# Patient Record
Sex: Male | Born: 1950 | Race: White | Hispanic: No | Smoking: Never smoker
Health system: Southern US, Community
[De-identification: ages and names within clinical notes are randomized; demographics above are authoritative.]

## PROBLEM LIST (undated history)

## (undated) DIAGNOSIS — I10 Essential (primary) hypertension: Secondary | ICD-10-CM

## (undated) DIAGNOSIS — E119 Type 2 diabetes mellitus without complications: Secondary | ICD-10-CM

## (undated) DIAGNOSIS — I2510 Atherosclerotic heart disease of native coronary artery without angina pectoris: Secondary | ICD-10-CM

## (undated) HISTORY — PX: OTHER SURGICAL HISTORY: SHX169

## (undated) HISTORY — PX: CARDIAC SURGERY: SHX584

## (undated) HISTORY — PX: ABDOMINAL SURGERY: SHX537

## (undated) HISTORY — PX: FRACTURE SURGERY: SHX138

## (undated) HISTORY — PX: NECK SURGERY: SHX720

## (undated) HISTORY — PX: EYE SURGERY: SHX253

---

## 2019-04-23 ENCOUNTER — Ambulatory Visit (INDEPENDENT_AMBULATORY_CARE_PROVIDER_SITE_OTHER): Payer: Self-pay

## 2019-04-23 ENCOUNTER — Encounter: Payer: Self-pay | Admitting: Emergency Medicine

## 2019-04-23 ENCOUNTER — Other Ambulatory Visit: Payer: Self-pay

## 2019-04-23 ENCOUNTER — Ambulatory Visit
Admission: EM | Admit: 2019-04-23 | Discharge: 2019-04-23 | Disposition: A | Discharge status: Home / Self Care | Payer: Self-pay

## 2019-04-23 DIAGNOSIS — M25552 Pain in left hip: Secondary | ICD-10-CM

## 2019-04-23 DIAGNOSIS — M25562 Pain in left knee: Secondary | ICD-10-CM

## 2019-04-23 DIAGNOSIS — I70209 Unspecified atherosclerosis of native arteries of extremities, unspecified extremity: Secondary | ICD-10-CM

## 2019-04-23 DIAGNOSIS — I70213 Atherosclerosis of native arteries of extremities with intermittent claudication, bilateral legs: Secondary | ICD-10-CM

## 2019-04-23 HISTORY — DX: Essential (primary) hypertension: I10

## 2019-04-23 HISTORY — DX: Atherosclerotic heart disease of native coronary artery without angina pectoris: I25.10

## 2019-04-23 HISTORY — DX: Type 2 diabetes mellitus without complications: E11.9

## 2019-04-23 MED ORDER — PREDNISONE 10 MG (21) PO TBPK: ORAL_TABLET | Freq: Every day | ORAL | 0 refills | Status: AC

## 2019-04-23 NOTE — Discharge Instructions (Addendum)
May take Prednisone 10mg  tablets - take 6 tablets today and tomorrow and then decrease by 1 tablet every 2 days until finished on day 12. May increase sugar levels and blood pressure so continue to monitor. Call the [facility] today to schedule appointment as soon as possible for follow-up for left hip pain and other concerns.

## 2019-04-23 NOTE — ED Provider Notes (Signed)
[facility]    CSN: [id number] Arrival date & time: 04/23/19  1144     History   Chief Complaint Chief Complaint  Patient presents with   Hip Pain    HPI Colin ArmourJohn Levine is a 68 y.o. male.   68 year old male presents with left hip and lower back pain that started about 1 week ago. He was outside and fell on his metal ramp up to his house. He landed on his left side and has experienced pain in his left hip and knee for the past week. His left hip pain is getting worse and he is concerned over possible fracture or other injury. He does have slight decreased sensation in his left leg which he is uncertain if it stems from his diabetes. He is able to walk with a cane. He has applied a "cream" for pain with minimal relief. He also has Ultram that he takes twice a day as needed for pain with minimal relief. He usually goes to the [facility] in [place] but he was unable to get a ride today and decided to come to Urgent Care here in [place] instead. He has a history of multiple chronic health issues including HTN, CAD, hyperlipidemia, Diabetes, Mood disorder, thyroid disorder, and arthritis. He is currently on Tenoretic, Imdur, Prazosin, Lisinopril, Sotalol, Lipitor, Glucophage, aspirin, Synthroid, Wellbutrin, Zoloft, Remeron, and Ultram daily. He is a poor historian and had difficulty with providing history of his current symptoms.   The history is provided by the patient.    Past Medical History:  Diagnosis Date   Coronary artery disease    Diabetes mellitus without complication (HCC)    Hypertension     There are no active problems to display for this patient.   Past Surgical History:  Procedure Laterality Date   ABDOMINAL SURGERY     bipass surgery     CARDIAC SURGERY     EYE SURGERY     FRACTURE SURGERY     NECK SURGERY         Home Medications    Prior to Admission medications   Medication Sig Start Date End Date Taking? Authorizing Provider  aspirin 81 MG  chewable tablet Chew by mouth daily.   Yes [provider]  atenolol-chlorthalidone (TENORETIC) 50-25 MG tablet Take 1 tablet by mouth daily.   Yes [provider]  atorvastatin (LIPITOR) 40 MG tablet Take 40 mg by mouth daily.   Yes [provider]  buPROPion (WELLBUTRIN) 100 MG tablet Take 100 mg by mouth 2 (two) times daily.   Yes [provider]  cetirizine (ZYRTEC) 10 MG tablet Take 10 mg by mouth daily.   Yes [provider]  isosorbide mononitrate (IMDUR) 30 MG 24 hr tablet Take 30 mg by mouth daily.   Yes [provider]  levothyroxine (SYNTHROID) 50 MCG tablet Take 50 mcg by mouth daily before breakfast.   Yes [provider]  lisinopril (ZESTRIL) 40 MG tablet Take 40 mg by mouth daily.   Yes [provider]  metFORMIN (GLUCOPHAGE) 500 MG tablet Take by mouth 2 (two) times daily with a meal.   Yes [provider]  mirtazapine (REMERON) 7.5 MG tablet Take 7.5 mg by mouth at bedtime.   Yes [provider]  prazosin (MINIPRESS) 5 MG capsule Take 5 mg by mouth at bedtime.   Yes [provider]  sennosides-docusate sodium (SENOKOT-S) 8.6-50 MG tablet Take 1 tablet by mouth daily.   Yes [provider]  sertraline (ZOLOFT) 100 MG tablet Take 100 mg by mouth daily.   Yes [provider]  sotalol (BETAPACE) 80 MG tablet Take 80 mg by mouth 2 (two) times daily.   Yes [provider]  traMADol (ULTRAM) 50 MG tablet Take by mouth every 6 (six) hours as needed.   Yes [provider]  predniSONE (STERAPRED UNI-PAK 21 TAB) 10 MG (21) TBPK tablet Take by mouth daily. Take 6 tabs by mouth daily  for 2 days, then decrease by 1 tablet every 2 days until finished on day 12. 04/23/19   Leland Raver, Ali LoweAnn Berry, NP    Family History Family History  Problem Relation Age of Onset   Diabetes Mother    Lupus Mother    Cancer Brother     Social History Social History   Tobacco  Use   Smoking status: Never Smoker   Smokeless tobacco: Never Used  Substance Use Topics   Alcohol use: Never    Frequency: Never   Drug use: Never     Allergies   Patient has no allergy information on record.   Review of Systems Review of Systems  Constitutional: Positive for activity change. Negative for appetite change, chills, fatigue and fever.  Eyes: Negative for photophobia and visual disturbance.  Respiratory: Negative for cough, chest tightness, shortness of breath and wheezing.   Cardiovascular: Positive for leg swelling. Negative for chest pain.  Gastrointestinal: Negative for abdominal pain, nausea and vomiting.  Genitourinary: Positive for decreased urine volume and difficulty urinating. Negative for flank pain, frequency, testicular pain and urgency.  Musculoskeletal: Positive for arthralgias, back pain, gait problem and myalgias. Negative for joint swelling.  Skin: Positive for color change. Negative for rash and wound.  Neurological: Positive for light-headedness and numbness. Negative for dizziness, tremors, seizures, syncope, speech difficulty, weakness and headaches.  Hematological: Negative for adenopathy. Bruises/bleeds easily.  Psychiatric/Behavioral: Positive for agitation and dysphoric mood.     Physical Exam Triage Vital Signs ED Triage Vitals  Enc Vitals Group     BP 04/23/19 1235 135/64     Pulse Rate 04/23/19 1235 68     Resp 04/23/19 1235 16     Temp 04/23/19 1235 97.8 F (36.6 C)     Temp Source 04/23/19 1235 Oral     SpO2 04/23/19 1235 98 %     Weight 04/23/19 1240 185 lb (83.9 kg)     Height 04/23/19 1240 5\' 9"  (1.753 m)     Head Circumference --      Peak Flow --      Pain Score 04/23/19 1239 9     Pain Loc --      Pain Edu? --      Excl. in GC? --    No data found.  Updated Vital Signs BP 135/64 (BP Location: Left Arm)    Pulse 68    Temp 97.8 F (36.6 C) (Oral)    Resp 16    Ht 5\' 9"  (1.753 m)    Wt 185 lb (83.9 kg)    SpO2  98%    BMI 27.32 kg/m   Visual Acuity Right Eye Distance:   Left Eye Distance:   Bilateral Distance:    Right Eye Near:   Left Eye Near:    Bilateral Near:     Physical Exam Vitals signs and nursing note reviewed.  Constitutional:      General: He is awake. He is not in acute distress.  Appearance: He is well-developed and well-groomed. He is not ill-appearing.     Comments: Patient sitting in wheel chair in no acute distress. Able to move to exam table with assistance.   HENT:     Head: Normocephalic and atraumatic.  Eyes:     Extraocular Movements: Extraocular movements intact.     Conjunctiva/sclera: Conjunctivae normal.  Neck:     Musculoskeletal: Normal range of motion.  Cardiovascular:     Rate and Rhythm: Normal rate.  Pulmonary:     Effort: Pulmonary effort is normal.  Musculoskeletal:        General: Tenderness present. No swelling.     Left hip: He exhibits decreased range of motion and tenderness. He exhibits normal strength, no swelling, no crepitus and no deformity.     Right lower leg: Edema present.     Left lower leg: Edema present.       Legs:     Comments: Has decreased range of motion of left hip, especially with flexion. Tender along more posterior aspect of hip and into buttock. Slight decreased sensation of left lower leg. Good distal pulses.  Normal range of motion of left knee. No bruising, swelling or tenderness present.   Skin:    General: Skin is warm.     Capillary Refill: Capillary refill takes less than 2 seconds.     Findings: Erythema present.     Comments: Bilateral lower leg erythema with darker discoloration of skin. Slight swelling present but no tenderness. No pitting edema.   Neurological:     General: No focal deficit present.     Mental Status: He is alert and oriented to person, place, and time. He is confused.     Sensory: Sensory deficit present.     Motor: Motor function is intact. No tremor, atrophy, abnormal muscle tone or  seizure activity.     Comments: Decreased sensation of left lower leg and foot.   Psychiatric:        Attention and Perception: Attention normal.        Mood and Affect: Mood normal.        Speech: Speech normal.        Behavior: Behavior is agitated. Behavior is cooperative.        Thought Content: Thought content normal.      UC Treatments / Results  Labs (all labs ordered are listed, but only abnormal results are displayed) Labs Reviewed - No data to display  EKG   Radiology Dg Hip Unilat With Pelvis 2-3 Views Left  Result Date: 04/23/2019 CLINICAL DATA:  Fall 1 week ago, pain LEFT lateral hip radiating anteriorly to knee. EXAM: DG HIP (WITH OR WITHOUT PELVIS) 2-3V LEFT COMPARISON:  None. FINDINGS: Osseous alignment is normal. Heterogeneous mineralization, but symmetric within the pelvis suggesting interspersed areas of osteopenia and normal mineralization. No circumscribed blastic or lytic appearing lesion. No fracture line or displaced fracture fragment seen. Mild degenerative narrowing at the bilateral hip joints, RIGHT greater than LEFT. No large osteophytes or other signs of advanced degenerative joint disease. 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 IMPRESSION: 1. No acute findings. No osseous fracture or dislocation. 2. Mild degenerative change at the bilateral hip joints, RIGHT greater than LEFT. 3. 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 Electronically Signed   By: Franki Cabot M.D.   On: 04/23/2019 13:50    Procedures Procedures (including critical care time)  Medications Ordered in UC Medications - No data to display  Initial  Impression / Assessment and Plan / UC Course  I have reviewed the triage vital signs and the nursing notes.  Pertinent labs & imaging results that were available during my care of the patient were reviewed by me and considered in my medical decision making (see chart for details).    Reviewed x-ray findings  with patient. No distinct fracture. Discussed arthritic changes in both hips. Also discussed femoral artery atherosclerosis and concern over decreased sensation in left leg. Discussed that he needs further evaluation with Vascular. Patient has a Development worker, international aidCardiologist at [facility]. Patient indicated that he has taken Prednisone before for inflammation with no side effects. Discussed that Prednisone can raise blood sugar (he is uncertain what his levels have been since his continuous glucose monitor is not connected) and can also raise blood pressure- need to continue to monitor. May take Prednisone 10mg  12 day dose pack as directed. [facility] and further evaluation of multiple concerns.  Final Clinical Impressions(s) / UC Diagnoses   Final diagnoses:  Acute hip pain, left  Acute pain of left knee  Femoral-popliteal artery atherosclerosis [facility](HCC)     Discharge Instructions     May take Prednisone 10mg  tablets - take 6 tablets today and tomorrow and then decrease by 1 tablet every 2 days until finished on day 12. May increase sugar levels and blood pressure so continue to monitor. [facility] to [facility] as soon as possible for follow-up for left hip pain and other concerns.     ED Prescriptions    Medication Sig Dispense Auth. Provider   predniSONE (STERAPRED UNI-PAK 21 TAB) 10 MG (21) TBPK tablet Take by mouth daily. Take 6 tabs by mouth daily  for 2 days, then decrease by 1 tablet every 2 days until finished on day 12. 42 tablet Diyana Starrett, Ali LoweAnn Berry, NP     Controlled Substance Prescriptions [place] Controlled Substance Registry consulted? Yes, I have consulted the [place] Controlled Substances Registry for this patient. His only active Rx is for Tramadol which he receives #60 tablets monthly. Do not feel any additional narcotic medication is indicated at this time.    Sudie GrumblingAmyot, Tatsuya Okray Berry, NP 04/23/19 2153

## 2019-04-23 NOTE — ED Triage Notes (Signed)
Patient states he fell a week ago and he has had sharp pain in his left hip since the fall

## 2019-04-29 ENCOUNTER — Encounter: Payer: Self-pay | Admitting: Emergency Medicine

## 2019-04-29 ENCOUNTER — Emergency Department
Admission: EM | Admit: 2019-04-29 | Discharge: 2019-04-29 | Disposition: A | Discharge status: Home / Self Care | Payer: No Typology Code available for payment source | Attending: Emergency Medicine | Admitting: Emergency Medicine

## 2019-04-29 ENCOUNTER — Emergency Department: Payer: No Typology Code available for payment source

## 2019-04-29 ENCOUNTER — Other Ambulatory Visit: Payer: Self-pay

## 2019-04-29 DIAGNOSIS — Z79899 Other long term (current) drug therapy: Secondary | ICD-10-CM | POA: Insufficient documentation

## 2019-04-29 DIAGNOSIS — M1712 Unilateral primary osteoarthritis, left knee: Secondary | ICD-10-CM | POA: Insufficient documentation

## 2019-04-29 DIAGNOSIS — Z7984 Long term (current) use of oral hypoglycemic drugs: Secondary | ICD-10-CM | POA: Diagnosis not present

## 2019-04-29 DIAGNOSIS — R300 Dysuria: Secondary | ICD-10-CM | POA: Diagnosis not present

## 2019-04-29 DIAGNOSIS — M25562 Pain in left knee: Secondary | ICD-10-CM | POA: Insufficient documentation

## 2019-04-29 DIAGNOSIS — E119 Type 2 diabetes mellitus without complications: Secondary | ICD-10-CM | POA: Insufficient documentation

## 2019-04-29 DIAGNOSIS — R109 Unspecified abdominal pain: Secondary | ICD-10-CM | POA: Insufficient documentation

## 2019-04-29 DIAGNOSIS — Z9181 History of falling: Secondary | ICD-10-CM | POA: Diagnosis not present

## 2019-04-29 DIAGNOSIS — Z7982 Long term (current) use of aspirin: Secondary | ICD-10-CM | POA: Insufficient documentation

## 2019-04-29 DIAGNOSIS — I2510 Atherosclerotic heart disease of native coronary artery without angina pectoris: Secondary | ICD-10-CM | POA: Insufficient documentation

## 2019-04-29 DIAGNOSIS — I10 Essential (primary) hypertension: Secondary | ICD-10-CM | POA: Diagnosis not present

## 2019-04-29 DIAGNOSIS — M79605 Pain in left leg: Secondary | ICD-10-CM | POA: Diagnosis present

## 2019-04-29 LAB — CBC WITH DIFFERENTIAL/PLATELET
Abs Immature Granulocytes: 0.02 10*3/uL (ref 0.00–0.07)
Basophils Absolute: 0 10*3/uL (ref 0.0–0.1)
Basophils Relative: 1 %
Eosinophils Absolute: 0.1 10*3/uL (ref 0.0–0.5)
Eosinophils Relative: 2 %
HCT: 35.7 % — ABNORMAL LOW (ref 39.0–52.0)
Hemoglobin: 11.4 g/dL — ABNORMAL LOW (ref 13.0–17.0)
Immature Granulocytes: 0 %
Lymphocytes Relative: 13 %
Lymphs Abs: 0.9 10*3/uL (ref 0.7–4.0)
MCH: 23.4 pg — ABNORMAL LOW (ref 26.0–34.0)
MCHC: 31.9 g/dL (ref 30.0–36.0)
MCV: 73.3 fL — ABNORMAL LOW (ref 80.0–100.0)
Monocytes Absolute: 0.5 10*3/uL (ref 0.1–1.0)
Monocytes Relative: 8 %
Neutro Abs: 5.1 10*3/uL (ref 1.7–7.7)
Neutrophils Relative %: 76 %
Platelets: 167 10*3/uL (ref 150–400)
RBC: 4.87 MIL/uL (ref 4.22–5.81)
RDW: 16.3 % — ABNORMAL HIGH (ref 11.5–15.5)
WBC: 6.7 10*3/uL (ref 4.0–10.5)
nRBC: 0 % (ref 0.0–0.2)

## 2019-04-29 LAB — URINALYSIS, COMPLETE (UACMP) WITH MICROSCOPIC
Bacteria, UA: NONE SEEN
Bilirubin Urine: NEGATIVE
Glucose, UA: >=500 mg/dL — AB
Hgb urine dipstick: NEGATIVE
Ketones, ur: NEGATIVE mg/dL
Leukocytes,Ua: NEGATIVE
Nitrite: NEGATIVE
Protein, ur: 100 mg/dL — AB
Specific Gravity, Urine: 1.01 (ref 1.005–1.030)
Squamous Epithelial / HPF: NONE SEEN (ref 0–5)
pH: 6 (ref 5.0–8.0)

## 2019-04-29 LAB — COMPREHENSIVE METABOLIC PANEL
ALT: 19 U/L (ref 0–44)
AST: 20 U/L (ref 15–41)
Albumin: 4 g/dL (ref 3.5–5.0)
Alkaline Phosphatase: 52 U/L (ref 38–126)
Anion gap: 9 (ref 5–15)
BUN: 41 mg/dL — ABNORMAL HIGH (ref 8–23)
CO2: 27 mmol/L (ref 22–32)
Calcium: 9.4 mg/dL (ref 8.9–10.3)
Chloride: 101 mmol/L (ref 98–111)
Creatinine, Ser: 1.74 mg/dL — ABNORMAL HIGH (ref 0.61–1.24)
GFR calc Af Amer: 45 mL/min — ABNORMAL LOW (ref >60)
GFR calc non Af Amer: 39 mL/min — ABNORMAL LOW (ref >60)
Glucose, Bld: 238 mg/dL — ABNORMAL HIGH (ref 70–99)
Potassium: 3.5 mmol/L (ref 3.5–5.1)
Sodium: 137 mmol/L (ref 135–145)
Total Bilirubin: 0.7 mg/dL (ref 0.3–1.2)
Total Protein: 7.1 g/dL (ref 6.5–8.1)

## 2019-04-29 MED ORDER — ACETAMINOPHEN 500 MG PO TABS
1000.0000 mg | ORAL_TABLET | Freq: Once | ORAL | Status: AC
  Administered 2019-04-29: 1000 mg via ORAL
  Filled 2019-04-29: qty 2

## 2019-04-29 MED ORDER — CHLORTHALIDONE 25 MG PO TABS
25.0000 mg | ORAL_TABLET | Freq: Every day | ORAL | Status: DC
  Filled 2019-04-29: qty 1

## 2019-04-29 MED ORDER — SODIUM CHLORIDE 0.9 % IV BOLUS
1000.0000 mL | Freq: Once | INTRAVENOUS | Status: AC
  Administered 2019-04-29: 1000 mL via INTRAVENOUS

## 2019-04-29 MED ORDER — ATENOLOL 25 MG PO TABS
50.0000 mg | ORAL_TABLET | Freq: Once | ORAL | Status: AC
  Administered 2019-04-29: 07:00:00 50 mg via ORAL
  Filled 2019-04-29: qty 2

## 2019-04-29 MED ORDER — LISINOPRIL 10 MG PO TABS
40.0000 mg | ORAL_TABLET | Freq: Once | ORAL | Status: AC
  Administered 2019-04-29: 40 mg via ORAL
  Filled 2019-04-29: qty 4

## 2019-04-29 MED ORDER — TRAMADOL HCL 50 MG PO TABS: 50.0000 mg | ORAL_TABLET | Freq: Four times a day (QID) | ORAL | 0 refills | Status: AC | PRN

## 2019-04-29 MED ORDER — FENTANYL CITRATE (PF) 100 MCG/2ML IJ SOLN
50.0000 ug | Freq: Once | INTRAMUSCULAR | Status: AC
  Administered 2019-04-29: 50 ug via INTRAVENOUS
  Filled 2019-04-29: qty 2

## 2019-04-29 NOTE — ED Notes (Signed)
Patient urinated 250 ml. Bladder scan post void 139.

## 2019-04-29 NOTE — ED Provider Notes (Signed)
[facility] Emergency Department Provider Note  ____________________________________________  Time seen: Approximately 6:25 AM  I have reviewed the triage vital signs and the nursing notes.   HISTORY  Chief Complaint Back Pain   HPI Colin Levine is a 68 y.o. male with a history of CAD, hypertension, diabetes who presents for evaluation of knee pain abdominal pain.  Patient reports that he has had sharp stabbing knee pain that is worse with ambulation for the last week since he had a fall.  His pain was extremely bad this past night.  Also endorses 2 weeks of dysuria and difficulty urinating.   Since last night patient started having pain in his suprapubic region and left flank which he describes as sharp, constant and moderate in intensity.  No nausea or vomiting.  No hip pain.   Past Medical History:  Diagnosis Date   Coronary artery disease    Diabetes mellitus without complication ([place])    Hypertension      Past Surgical History:  Procedure Laterality Date   ABDOMINAL SURGERY     bipass surgery     CARDIAC SURGERY     EYE SURGERY     FRACTURE SURGERY     NECK SURGERY      Prior to Admission medications   Medication Sig Start Date End Date Taking? Authorizing Provider  aspirin 81 MG chewable tablet Chew by mouth daily.    [provider]  atenolol-chlorthalidone (TENORETIC) 50-25 MG tablet Take 1 tablet by mouth daily.    [provider]  atorvastatin (LIPITOR) 40 MG tablet Take 40 mg by mouth daily.    [provider]  buPROPion (WELLBUTRIN) 100 MG tablet Take 100 mg by mouth 2 (two) times daily.    [provider]  cetirizine (ZYRTEC) 10 MG tablet Take 10 mg by mouth daily.    [provider]  isosorbide mononitrate (IMDUR) 30 MG 24 hr tablet Take 30 mg by mouth daily.    [provider]  levothyroxine (SYNTHROID) 50 MCG tablet Take 50 mcg by mouth daily before breakfast.     [provider]  lisinopril (ZESTRIL) 40 MG tablet Take 40 mg by mouth daily.    [provider]  metFORMIN (GLUCOPHAGE) 500 MG tablet Take by mouth 2 (two) times daily with a meal.    [provider]  mirtazapine (REMERON) 7.5 MG tablet Take 7.5 mg by mouth at bedtime.    [provider]  prazosin (MINIPRESS) 5 MG capsule Take 5 mg by mouth at bedtime.    [provider]  predniSONE (STERAPRED UNI-PAK 21 TAB) 10 MG (21) TBPK tablet Take by mouth daily. Take 6 tabs by mouth daily  for 2 days, then decrease by 1 tablet every 2 days until finished on day 12. 04/23/19   Amyot, Nicholes Stairs, NP  sennosides-docusate sodium (SENOKOT-S) 8.6-50 MG tablet Take 1 tablet by mouth daily.    [provider]  sertraline (ZOLOFT) 100 MG tablet Take 100 mg by mouth daily.    [provider]  sotalol (BETAPACE) 80 MG tablet Take 80 mg by mouth 2 (two) times daily.    [provider]  traMADol (ULTRAM) 50 MG tablet Take by mouth every 6 (six) hours as needed.    [provider]    Allergies Patient has no known allergies.  Family History  Problem Relation Age of Onset   Diabetes Mother    Lupus Mother    Cancer Brother  Social History Social History   Tobacco Use   Smoking status: Never Smoker   Smokeless tobacco: Never Used  Substance Use Topics   Alcohol use: Never    Frequency: Never   Drug use: Never    Review of Systems  Constitutional: Negative for fever. Eyes: Negative for visual changes. ENT: Negative for sore throat. Neck: No neck pain  Cardiovascular: Negative for chest pain. Respiratory: Negative for shortness of breath. Gastrointestinal: Negative for abdominal pain, vomiting or diarrhea. Genitourinary: + dysuria, frequency, L flank pain Musculoskeletal: Negative for back pain. + L knee pain Skin: Negative for rash. Neurological: Negative for headaches, weakness or numbness. Psych: No SI or  HI  ____________________________________________   PHYSICAL EXAM:  VITAL SIGNS: ED Triage Vitals  Enc Vitals Group     BP 04/29/19 0237 (!) 194/93     Pulse Rate 04/29/19 0237 78     Resp 04/29/19 0237 20     Temp 04/29/19 0237 98.5 F (36.9 C)     Temp Source 04/29/19 0237 Oral     SpO2 04/29/19 0237 97 %     Weight 04/29/19 0238 185 lb (83.9 kg)     Height 04/29/19 0238 5\' 10"  (1.778 m)     Head Circumference --      Peak Flow --      Pain Score 04/29/19 0238 8     Pain Loc --      Pain Edu? --      Excl. in GC? --     Constitutional: Alert and oriented. Well appearing and in no apparent distress. HEENT:      Head: Normocephalic and atraumatic.         Eyes: Conjunctivae are normal. Sclera is non-icteric.       Mouth/Throat: Mucous membranes are moist.       Neck: Supple with no signs of meningismus. Cardiovascular: Regular rate and rhythm. No murmurs, gallops, or rubs. 2+ symmetrical distal pulses are present in all extremities. No JVD. Respiratory: Normal respiratory effort. Lungs are clear to auscultation bilaterally. No wheezes, crackles, or rhonchi.  Gastrointestinal: Soft, tender to palpation over the suprapubic and left quadrant, and non distended with positive bowel sounds. No rebound or guarding. Genitourinary: No CVA tenderness.  Right inguinal hernia with no overlying skin changes, soft and nontender Musculoskeletal: Nontender with normal range of motion in all extremities. No edema, cyanosis, or erythema of extremities. Neurologic: Normal speech and language. Face is symmetric. Moving all extremities. No gross focal neurologic deficits are appreciated. Skin: Skin is warm, dry and intact. No rash noted. Psychiatric: Mood and affect are normal. Speech and behavior are normal.  ____________________________________________   LABS (all labs ordered are listed, but only abnormal results are displayed)  Labs Reviewed  CBC WITH DIFFERENTIAL/PLATELET - Abnormal;  Notable for the following components:      Result Value   Hemoglobin 11.4 (*)    HCT 35.7 (*)    MCV 73.3 (*)    MCH 23.4 (*)    RDW 16.3 (*)    All other components within normal limits  COMPREHENSIVE METABOLIC PANEL - Abnormal; Notable for the following components:   Glucose, Bld 238 (*)    BUN 41 (*)    Creatinine, Ser 1.74 (*)    GFR calc non Af Amer 39 (*)    GFR calc Af Amer 45 (*)    All other components within normal limits  URINALYSIS, COMPLETE (UACMP) WITH MICROSCOPIC   ____________________________________________  EKG  none  ____________________________________________  RADIOLOGY  I have personally reviewed the images performed during this visit and I agree with the Radiologist's read.   Interpretation by Radiologist:  Dg Knee Complete 4 Views Left  Result Date: 04/29/2019 CLINICAL DATA:  Fall.  Pain upper leg. EXAM: LEFT KNEE - COMPLETE 4+ VIEW COMPARISON:  No recent. FINDINGS: No acute bony or joint abnormality identified. Tricompartment degenerative change. Loose bodies may be present. No acute bony or joint abnormality. No evidence of fracture dislocation. Peripheral vascular calcification. Surgical clips noted posteriorly. IMPRESSION: Tricompartment degenerative change. Loose bodies may be present. No acute bony or joint abnormality. Electronically Signed   By: Maisie Fushomas  Register   On: 04/29/2019 06:10   Ct Renal Stone Study  Result Date: 04/29/2019 CLINICAL DATA:  Pyelonephritis, uncomplicated EXAM: CT ABDOMEN AND PELVIS WITHOUT CONTRAST TECHNIQUE: Multidetector CT imaging of the abdomen and pelvis was performed following the standard protocol without IV contrast. COMPARISON:  None. FINDINGS: Lower chest:  Atherosclerosis with CABG.  No acute finding. Hepatobiliary: No focal liver abnormality.Cholelithiasis without evidence of biliary inflammation Pancreas: Unremarkable. Spleen: Unremarkable. Adrenals/Urinary Tract: Negative adrenals. No hydronephrosis or stone.  Moderate distension of the bladder. Stomach/Bowel: No obstruction. Desiccated stool seen throughout the colon. No evidence of bowel inflammation or obstruction. Vascular/Lymphatic: Atherosclerotic calcification. No acute vascular finding. no mass or adenopathy. Reproductive:No pathologic findings. Other: No ascites or pneumoperitoneum. Large right inguinal hernia containing nonobstructed small bowel. Musculoskeletal: Generalized disc and facet degeneration. No acute osseous finding. IMPRESSION: 1. No hydronephrosis or urolithiasis. 2. Constipated appearance. 3. Cholelithiasis. 4. Large right inguinal hernia containing small bowel. Electronically Signed   By: Marnee SpringJonathon  Watts M.D.   On: 04/29/2019 06:07     ____________________________________________   PROCEDURES  Procedure(s) performed: None Procedures Critical Care performed:  None ____________________________________________   INITIAL IMPRESSION / ASSESSMENT AND PLAN / ED COURSE   68 y.o. male with a history of CAD, hypertension, diabetes who presents for evaluation of knee pain abdominal pain.   #knee pain: Started after a mechanical fall.  On exam patient has diffuse tenderness but no bruising of the knee.  X-ray showing DJD, most likely exacerbated by the fall.  Patient was prescribed Percocet by his primary care doctor  #Dysuria, abdominal pain, flank pain: Possible UTI versus pyelonephritis versus kidney stone.  CT renal negative for kidney stone.  Patient does have a large right inguinal hernia with no overlying skin changes, no tenderness, soft on exam with no concerns for obstruction or incarceration.  UA is pending to rule out UTI.  No signs of sepsis with normal heart rate, no fever, normal white count.  Creatinine is slightly elevated at 1.74 (baseline is 1.1-1.4).  Will give IV fluids.  Care transferred to Dr. Cyril LoosenKinner at 7 AM       As part of my medical decision making, I reviewed the following data within the electronic  MEDICAL RECORD NUMBER Nursing notes reviewed and incorporated, Labs reviewed , Old chart reviewed, Radiograph reviewed , Notes from prior ED visits and [place] Controlled Substance Database   Patient was evaluated in Emergency Department today for the symptoms described in the history of present illness. Patient was evaluated in the context of the global COVID-19 pandemic, which necessitated consideration that the patient might be at risk for infection with the SARS-CoV-2 virus that causes COVID-19. Institutional protocols and algorithms that pertain to the evaluation of patients at risk for COVID-19 are in a state of rapid change based on information released by regulatory bodies  including the CDC and federal and state organizations. These policies and algorithms were followed during the patient's care in the ED.   ____________________________________________   FINAL CLINICAL IMPRESSION(S) / ED DIAGNOSES   Final diagnoses:  Acute pain of left knee  Osteoarthritis of left knee, unspecified osteoarthritis type  Dysuria  Abdominal pain, unspecified abdominal location      NEW MEDICATIONS STARTED DURING THIS VISIT:  ED Discharge Orders    None       Note:  This document was prepared using Dragon voice recognition software and may include unintentional dictation errors.    Nita SickleVeronese, [place], MD 04/29/19 [phone]

## 2019-04-29 NOTE — ED Notes (Signed)
Pt oob walking with cane in room.

## 2019-04-29 NOTE — ED Notes (Signed)
Patient transported to CT 

## 2019-04-29 NOTE — ED Triage Notes (Signed)
Pt arrives via [facility] with c/o left leg pain which radiates into his lower back. Pt was seen previously for the same. Pt is in NAD.

## 2019-04-29 NOTE — ED Notes (Signed)
Patient with complaint of difficulty with urination times two weeks. Patient states that he also has pain with urination. Patient states that he fell about a week ago and the next day started having pain to left upper leg. Patient states that the pain now radiates from his left knee up to his lower abdomen and lower back. Patient states that he was seen at urgent care and was given steroids but that the pain has not improved.

## 2019-04-29 NOTE — ED Provider Notes (Signed)
Urinalysis unremarkable, will Rx Ultram for musculoskeletal pain, recommend follow-up with orthopedics for consideration of knee replacement, BP improved significantly after home medications   Lavonia Drafts, MD 04/29/19 [phone]

## 2019-04-29 NOTE — ED Notes (Signed)
First nurse note, report received from EMS Pt ambulatory with his cane, c/o left leg pain that radiates up his left leg into the left side of his back around to the left side of his abd. Pt was seen a week ago at urgent care and prescribed prednisone for inflammation 18g Left AC 223/99 79 heart rate 98% RA NSR fsbs of 251

## 2019-05-05 ENCOUNTER — Emergency Department: Payer: No Typology Code available for payment source

## 2019-05-05 ENCOUNTER — Other Ambulatory Visit: Payer: Self-pay

## 2019-05-05 DIAGNOSIS — I2510 Atherosclerotic heart disease of native coronary artery without angina pectoris: Secondary | ICD-10-CM | POA: Diagnosis not present

## 2019-05-05 DIAGNOSIS — E119 Type 2 diabetes mellitus without complications: Secondary | ICD-10-CM | POA: Insufficient documentation

## 2019-05-05 DIAGNOSIS — Z7982 Long term (current) use of aspirin: Secondary | ICD-10-CM | POA: Diagnosis not present

## 2019-05-05 DIAGNOSIS — Z79899 Other long term (current) drug therapy: Secondary | ICD-10-CM | POA: Diagnosis not present

## 2019-05-05 DIAGNOSIS — I10 Essential (primary) hypertension: Secondary | ICD-10-CM | POA: Diagnosis not present

## 2019-05-05 DIAGNOSIS — M79605 Pain in left leg: Secondary | ICD-10-CM | POA: Diagnosis not present

## 2019-05-05 LAB — COMPREHENSIVE METABOLIC PANEL
ALT: 18 U/L (ref 0–44)
AST: 20 U/L (ref 15–41)
Albumin: 3.9 g/dL (ref 3.5–5.0)
Alkaline Phosphatase: 49 U/L (ref 38–126)
Anion gap: 10 (ref 5–15)
BUN: 38 mg/dL — ABNORMAL HIGH (ref 8–23)
CO2: 24 mmol/L (ref 22–32)
Calcium: 8.1 mg/dL — ABNORMAL LOW (ref 8.9–10.3)
Chloride: 99 mmol/L (ref 98–111)
Creatinine, Ser: 2.06 mg/dL — ABNORMAL HIGH (ref 0.61–1.24)
GFR calc Af Amer: 37 mL/min — ABNORMAL LOW (ref >60)
GFR calc non Af Amer: 32 mL/min — ABNORMAL LOW (ref >60)
Glucose, Bld: 285 mg/dL — ABNORMAL HIGH (ref 70–99)
Potassium: 3.6 mmol/L (ref 3.5–5.1)
Sodium: 133 mmol/L — ABNORMAL LOW (ref 135–145)
Total Bilirubin: 0.7 mg/dL (ref 0.3–1.2)
Total Protein: 6.2 g/dL — ABNORMAL LOW (ref 6.5–8.1)

## 2019-05-05 LAB — CBC WITH DIFFERENTIAL/PLATELET
Abs Immature Granulocytes: 0.04 10*3/uL (ref 0.00–0.07)
Basophils Absolute: 0 10*3/uL (ref 0.0–0.1)
Basophils Relative: 0 %
Eosinophils Absolute: 0.2 10*3/uL (ref 0.0–0.5)
Eosinophils Relative: 2 %
HCT: 32.1 % — ABNORMAL LOW (ref 39.0–52.0)
Hemoglobin: 10.1 g/dL — ABNORMAL LOW (ref 13.0–17.0)
Immature Granulocytes: 1 %
Lymphocytes Relative: 12 %
Lymphs Abs: 0.8 10*3/uL (ref 0.7–4.0)
MCH: 24 pg — ABNORMAL LOW (ref 26.0–34.0)
MCHC: 31.5 g/dL (ref 30.0–36.0)
MCV: 76.4 fL — ABNORMAL LOW (ref 80.0–100.0)
Monocytes Absolute: 0.5 10*3/uL (ref 0.1–1.0)
Monocytes Relative: 7 %
Neutro Abs: 5.3 10*3/uL (ref 1.7–7.7)
Neutrophils Relative %: 78 %
Platelets: 160 10*3/uL (ref 150–400)
RBC: 4.2 MIL/uL — ABNORMAL LOW (ref 4.22–5.81)
RDW: 16.6 % — ABNORMAL HIGH (ref 11.5–15.5)
WBC: 6.9 10*3/uL (ref 4.0–10.5)
nRBC: 0 % (ref 0.0–0.2)

## 2019-05-05 NOTE — ED Triage Notes (Signed)
Pt with left leg pain that has been present for 1 week. Pt with strong pulse noted to leg, no redness, swelling or discoloration noted. Ems states when they arrived to pt pt with systolic in 01V, gave pt 615PP NS with improvement in blood pressure. Pt appears in no acute distress. Pt states this afternoon he felt faint from pain before he called ems.

## 2019-05-06 ENCOUNTER — Emergency Department: Payer: No Typology Code available for payment source

## 2019-05-06 ENCOUNTER — Encounter: Payer: Self-pay | Admitting: Radiology

## 2019-05-06 ENCOUNTER — Emergency Department
Admission: EM | Admit: 2019-05-06 | Discharge: 2019-05-06 | Discharge status: Against Medical Advice | Payer: No Typology Code available for payment source | Attending: Emergency Medicine | Admitting: Emergency Medicine

## 2019-05-06 DIAGNOSIS — M79605 Pain in left leg: Secondary | ICD-10-CM

## 2019-05-06 DIAGNOSIS — I739 Peripheral vascular disease, unspecified: Secondary | ICD-10-CM

## 2019-05-06 LAB — CK: Total CK: 105 U/L (ref 49–397)

## 2019-05-06 MED ORDER — IOHEXOL 350 MG/ML SOLN: 125.0000 mL | Freq: Once | INTRAVENOUS | Status: DC | PRN

## 2019-05-06 MED ORDER — SODIUM CHLORIDE 0.9 % IV BOLUS
1000.0000 mL | Freq: Once | INTRAVENOUS | Status: AC
  Administered 2019-05-06: 1000 mL via INTRAVENOUS

## 2019-05-06 NOTE — ED Provider Notes (Signed)
[facility] Emergency Department Provider Note   First MD Initiated Contact with Patient 05/06/19 0150     (approximate)  I have reviewed the triage vital signs and the nursing notes.   HISTORY  Chief Complaint Leg Pain   HPI Colin Levine is a 68 y.o. male with below list of previous medical conditions presents to the emergency department secondary to left leg pain x1 week.  Patient describes the pain as throbbing and intense when it does occur stating that the pain is 10 out of 10 at its maximum.  Patient denies any pain at present.  Patient denies any weakness in the leg.  Patient denies any appreciated temperature just discrepancy between both legs.  Patient denies any decrease in sensation.  Patient was seen in the emergency department on 04/29/2019 for the same at which point x-ray revealed left knee degenerative changes.  Patient states that the pain is throbbing from thigh down to foot.     Past Medical History:  Diagnosis Date   Coronary artery disease    Diabetes mellitus without complication (HCC)    Hypertension     There are no active problems to display for this patient.   Past Surgical History:  Procedure Laterality Date   ABDOMINAL SURGERY     bipass surgery     CARDIAC SURGERY     EYE SURGERY     FRACTURE SURGERY     NECK SURGERY      Prior to Admission medications   Medication Sig Start Date End Date Taking? Authorizing Provider  acetaminophen (TYLENOL) 325 MG tablet Take 650 mg by mouth every 6 (six) hours as needed.    [provider]  aspirin 81 MG chewable tablet Chew by mouth daily.    [provider]  atenolol-chlorthalidone (TENORETIC) 50-25 MG tablet Take 1 tablet by mouth daily.    [provider]  atorvastatin (LIPITOR) 40 MG tablet Take 40 mg by mouth daily.    [provider]  buPROPion (WELLBUTRIN) 100 MG tablet Take 100 mg by mouth 2 (two) times daily.    [provider]  cetirizine (ZYRTEC) 10 MG tablet Take 10 mg by mouth daily.    [provider]  isosorbide mononitrate (IMDUR) 30 MG 24 hr tablet Take 30 mg by mouth daily.    [provider]  levothyroxine (SYNTHROID) 50 MCG tablet Take 50 mcg by mouth daily before breakfast.    [provider]  lisinopril (ZESTRIL) 40 MG tablet Take 40 mg by mouth daily.    [provider]  metFORMIN (GLUCOPHAGE) 500 MG tablet Take 500 mg by mouth daily.     [provider]  mirtazapine (REMERON) 7.5 MG tablet Take 7.5 mg by mouth at bedtime.    [provider]  prazosin (MINIPRESS) 5 MG capsule Take 5 mg by mouth at bedtime.    [provider]  predniSONE (STERAPRED UNI-PAK 21 TAB) 10 MG (21) TBPK tablet Take by mouth daily. Take 6 tabs by mouth daily  for 2 days, then decrease by 1 tablet every 2 days until finished on day 12. 04/23/19   Amyot, Ali LoweAnn Berry, NP  sennosides-docusate sodium (SENOKOT-S) 8.6-50 MG tablet Take 1 tablet by mouth daily.    [provider]  sertraline (ZOLOFT) 100 MG tablet Take 100 mg by mouth daily.    [provider]  sotalol (BETAPACE) 80 MG tablet Take 80 mg by mouth 2 (two) times daily.  [provider]  traMADol (ULTRAM) 50 MG tablet Take 1 tablet (50 mg total) by mouth every 6 (six) hours as needed. 04/29/19 04/28/20  Jene EveryKinner, Robert, MD    Allergies Patient has no known allergies.  Family History  Problem Relation Age of Onset   Diabetes Mother    Lupus Mother    Cancer Brother     Social History Social History   Tobacco Use   Smoking status: Never Smoker   Smokeless tobacco: Never Used  Substance Use Topics   Alcohol use: Never    Frequency: Never   Drug use: Never    Review of Systems Constitutional: No fever/chills Eyes: No visual changes. ENT: No sore throat. Cardiovascular: Denies chest pain. Respiratory: Denies shortness of breath. Gastrointestinal: No  abdominal pain.  No nausea, no vomiting.  No diarrhea.  No constipation. Genitourinary: Negative for dysuria. Musculoskeletal: Negative for neck pain.  Negative for back pain.  Positive for left leg pain Integumentary: Negative for rash. Neurological: Negative for headaches, focal weakness or numbness. ___________________   PHYSICAL EXAM:  VITAL SIGNS: ED Triage Vitals  Enc Vitals Group     BP 05/05/19 2208 134/64     Pulse Rate 05/05/19 2208 81     Resp 05/05/19 2208 18     Temp 05/05/19 2208 98 F (36.7 C)     Temp Source 05/05/19 2208 Oral     SpO2 05/05/19 2208 98 %     Weight 05/05/19 2209 80.7 kg (178 lb)     Height 05/05/19 2209 1.778 m (5\' 10" )     Head Circumference --      Peak Flow --      Pain Score 05/05/19 2209 6     Pain Loc --      Pain Edu? --      Excl. in GC? --     Constitutional: Alert and oriented. Well appearing and in no acute distress. Eyes: Conjunctivae are normal.  Mouth/Throat: Mucous membranes are moist.  Oropharynx non-erythematous. Neck: No stridor.   Cardiovascular: Normal rate, regular rhythm. Good peripheral circulation. Grossly normal heart sounds. Respiratory: Normal respiratory effort.  No retractions. No audible wheezing. Gastrointestinal: Soft and nontender. No distention.  Musculoskeletal: No lower extremity tenderness nor edema. No gross deformities of extremities.  Bilateral extremity skin changes consistent with peripheral vascular disease. Neurologic:  Normal speech and language. No gross focal neurologic deficits are appreciated.  Skin: Bilateral lower extremity skin changes consistent with peripheral vascular disease Psychiatric: Mood and affect are normal. Speech and behavior are normal.  ____________________________________________   LABS (all labs ordered are listed, but only abnormal results are displayed)  Labs Reviewed  CBC WITH DIFFERENTIAL/PLATELET - Abnormal; Notable for the following components:      Result Value    RBC 4.20 (*)    Hemoglobin 10.1 (*)    HCT 32.1 (*)    MCV 76.4 (*)    MCH 24.0 (*)    RDW 16.6 (*)    All other components within normal limits  COMPREHENSIVE METABOLIC PANEL - Abnormal; Notable for the following components:   Sodium 133 (*)    Glucose, Bld 285 (*)    BUN 38 (*)    Creatinine, Ser 2.06 (*)    Calcium 8.1 (*)    Total Protein 6.2 (*)    GFR calc non Af Amer 32 (*)    GFR calc Af Amer 37 (*)    All other components within normal limits  CK  RADIOLOGY I, [place] Addalynn Kumari, personally viewed and evaluated these images (plain radiographs) as part of my medical decision making, as well as reviewing the written report by the radiologist.  ED MD interpretation: Venous ultrasound of the left lower extremity revealed no evidence of DVT.  Official radiology report(s): [place] Venous Img Lower Unilateral Left  Result Date: 05/05/2019 CLINICAL DATA:  LEFT leg pain for 3 weeks; history hypertension, diabetes mellitus, coronary artery disease EXAM: LEFT LOWER EXTREMITY VENOUS DOPPLER ULTRASOUND TECHNIQUE: Gray-scale sonography with graded compression, as well as color Doppler and duplex ultrasound were performed to evaluate the lower extremity deep venous systems from the level of the common femoral vein and including the common femoral, femoral, profunda femoral, popliteal and calf veins including the posterior tibial, peroneal and gastrocnemius veins when visible. The superficial great saphenous vein was also interrogated. Spectral Doppler was utilized to evaluate flow at rest and with distal augmentation maneuvers in the common femoral, femoral and popliteal veins. COMPARISON:  None FINDINGS: Contralateral Common Femoral Vein: Respiratory phasicity is normal and symmetric with the symptomatic side. No evidence of thrombus. Normal compressibility. Common Femoral Vein: No evidence of thrombus. Normal compressibility, respiratory phasicity and response to augmentation. Saphenofemoral  Junction: No evidence of thrombus. Normal compressibility and flow on color Doppler imaging. Profunda Femoral Vein: No evidence of thrombus. Normal compressibility and flow on color Doppler imaging. Femoral Vein: No evidence of thrombus. Normal compressibility, respiratory phasicity and response to augmentation. Popliteal Vein: No evidence of thrombus. Normal compressibility, respiratory phasicity and response to augmentation. Calf Veins: No evidence of thrombus. Normal compressibility and flow on color Doppler imaging. Superficial Great Saphenous Vein: No evidence of thrombus. Normal compressibility. Venous Reflux:  None. Other Findings:  None. IMPRESSION: No evidence of deep venous thrombosis in the LEFT lower extremity. Electronically Signed   By: Ulyses SouthwardMark  Boles M.D.   On: 05/05/2019 23:03     Procedures   ____________________________________________   INITIAL IMPRESSION / MDM / ASSESSMENT AND PLAN / ED COURSE  As part of my medical decision making, I reviewed the following data within the electronic MEDICAL RECORD NUMBER   68 year old male presenting with above-stated history and physical exam secondary to left leg pain.  Concern for possible arterial occlusion as the etiology for the patient's pain and as such CT angiogram of the lower extremity ordered however patient refused to have this performed.  Patient is refusing any additional testing to be performed and states that he has an [facility] this [facility] and will not have anything done until he sees that physician.  I spoke with the patient at length explaining to him the risk associated with an arterial thrombus including the possibility of losing his limb or life.  Patient adamantly refused any additional testing despite being thoroughly informed.  Patient is leaving the emergency department AGAINST MEDICAL ADVICE.      ____________________________________________  FINAL CLINICAL IMPRESSION(S) / ED  DIAGNOSES  Final diagnoses:  Left leg pain  PVD (peripheral vascular disease) (HCC)     MEDICATIONS GIVEN DURING THIS VISIT:  Medications  iohexol (OMNIPAQUE) 350 MG/ML injection 125 mL (has no administration in time range)  sodium chloride 0.9 % bolus 1,000 mL (1,000 mLs Intravenous New Bag/Given 05/06/19 0217)     ED Discharge Orders    None      *Please note:  Colin ArmourJohn Deliz was evaluated in Emergency Department on 05/06/2019 for the symptoms described in the history of present illness. He was evaluated  in the context of the global COVID-19 pandemic, which necessitated consideration that the patient might be at risk for infection with the SARS-CoV-2 virus that causes COVID-19. Institutional protocols and algorithms that pertain to the evaluation of patients at risk for COVID-19 are in a state of rapid change based on information released by regulatory bodies including the CDC and federal and state organizations. These policies and algorithms were followed during the patient's care in the ED.  Some ED evaluations and interventions may be delayed as a result of limited staffing during the pandemic.*  Note:  This document was prepared using Dragon voice recognition software and may include unintentional dictation errors.   Gregor Hams, MD 05/06/19 [phone]

## 2019-05-06 NOTE — ED Notes (Signed)
Patient refusing CT scan and doppler of foot. Patient states that he is ready to leave. Dr. Owens Shark at the bedside and informed patient of risk of leaving against medical advise. Patient continues to state that he is leaving. Patient states that he has a telephone appointment with his pcp in the morning.

## 2020-11-30 IMAGING — CT CT RENAL STONE PROTOCOL
2 of 4 series · 16 of 46 positions shown, 18 images · non-contrast
Comparison: None.

CLINICAL DATA: Pyelonephritis, uncomplicated

EXAM:
CT ABDOMEN AND PELVIS WITHOUT CONTRAST
TECHNIQUE: Multidetector CT imaging of the abdomen and pelvis was performed
following the standard protocol without IV contrast.

[Series 2: stone full standard · axial · 0.83mm/px · z∈[-1239,-769]mm · 13 of 104 slices shown, 15 images]
[im 5/104  soft-tissue]
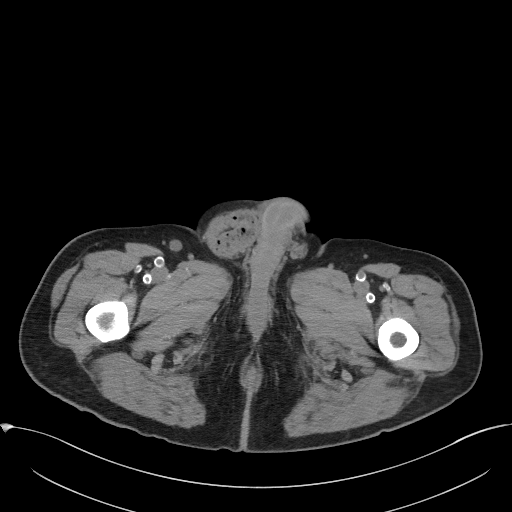
[im 5/104  bone]
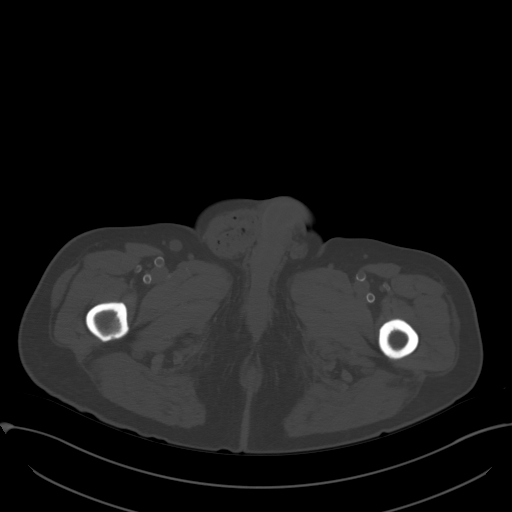
[im 14/104  soft-tissue]
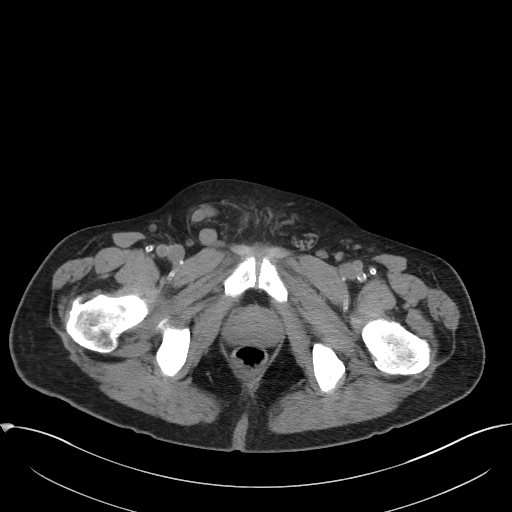
[im 23/104  soft-tissue]
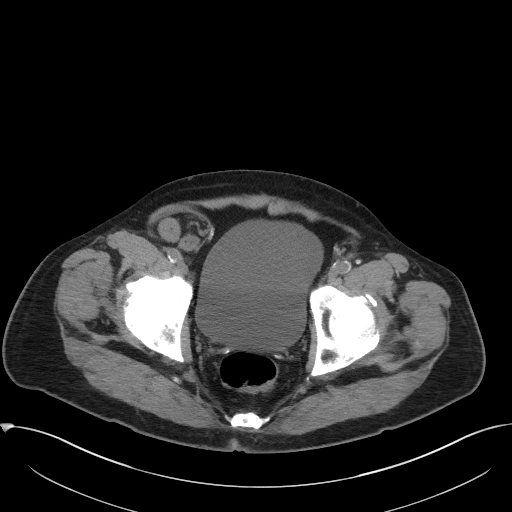
[im 27/104  soft-tissue]
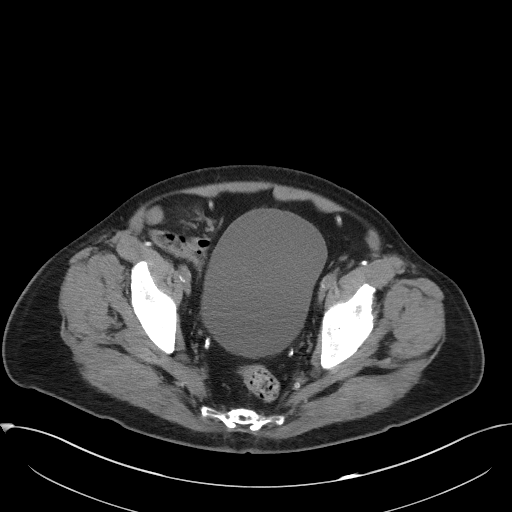
[im 36/104  soft-tissue]
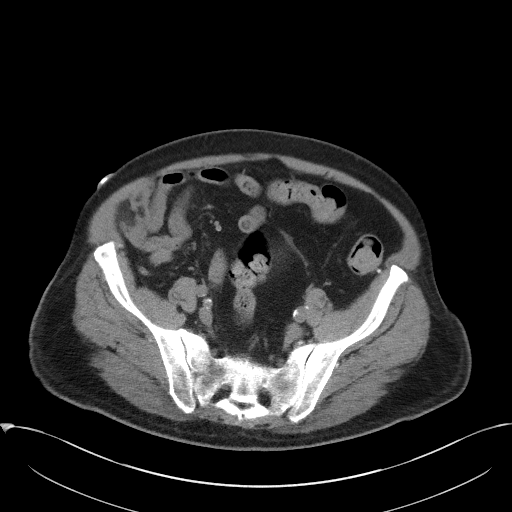
[im 45/104  soft-tissue]
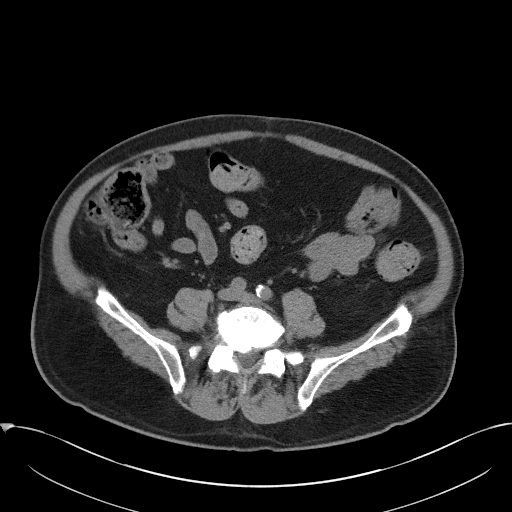
[im 54/104  soft-tissue]
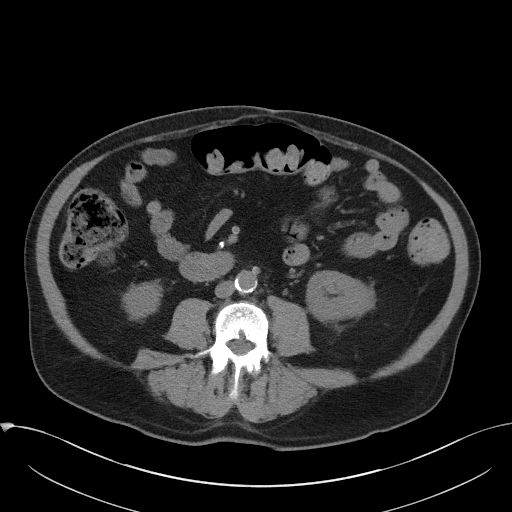
[im 59/104  soft-tissue]
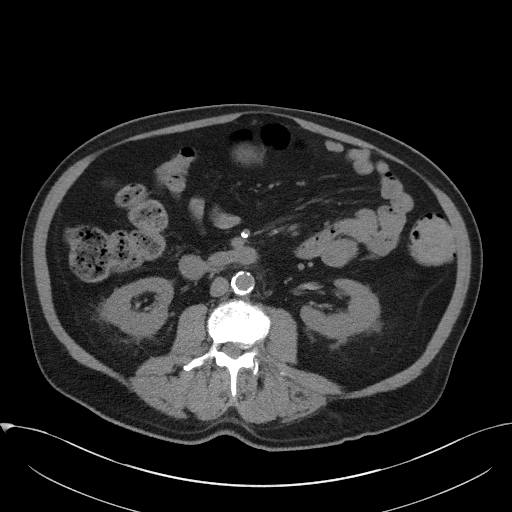
[im 68/104  soft-tissue]
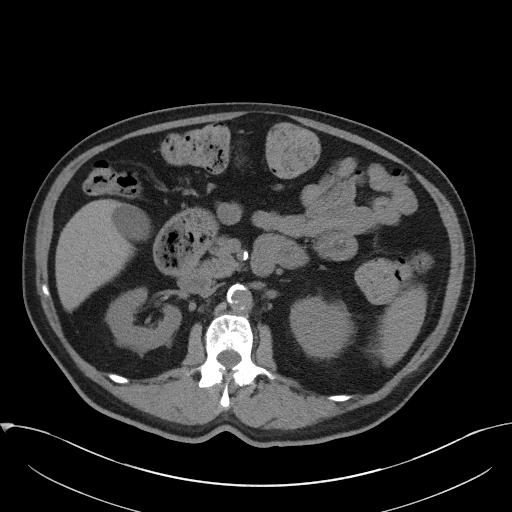
[im 68/104  bone]
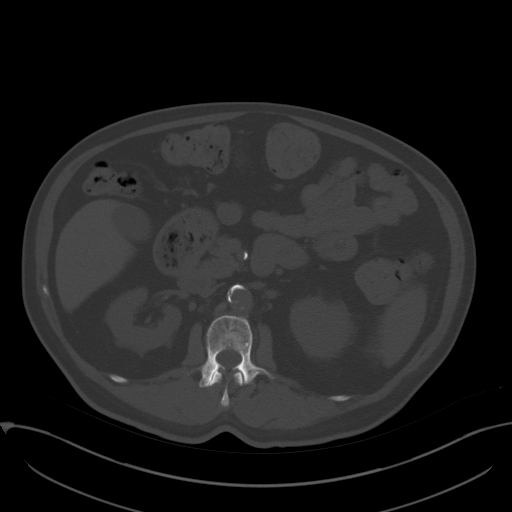
[im 77/104  soft-tissue]
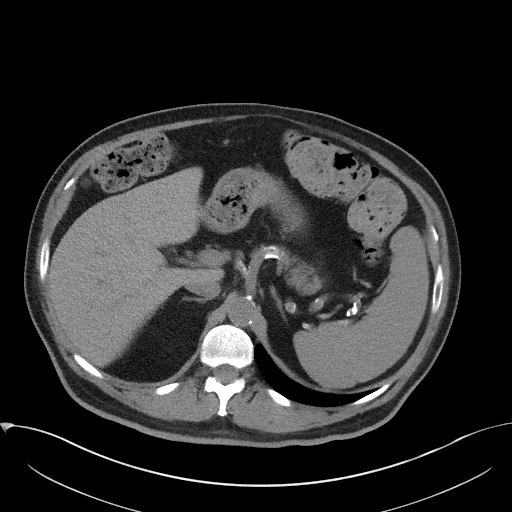
[im 81/104  soft-tissue]
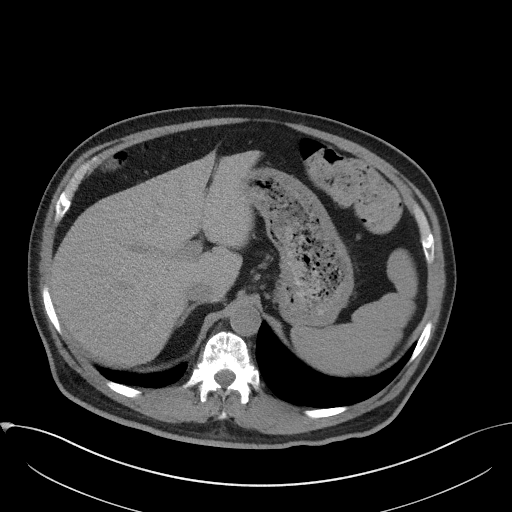
[im 90/104  soft-tissue]
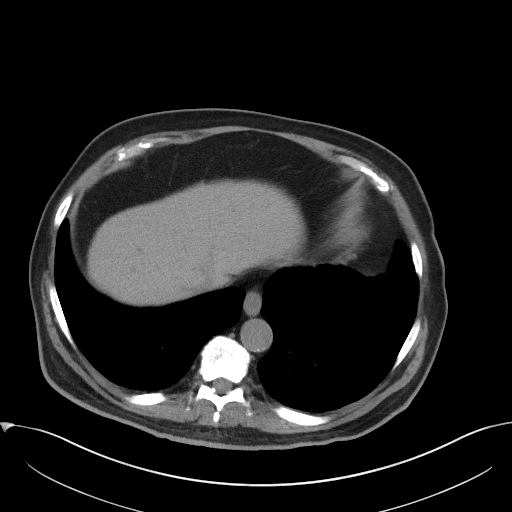
[im 99/104  soft-tissue]
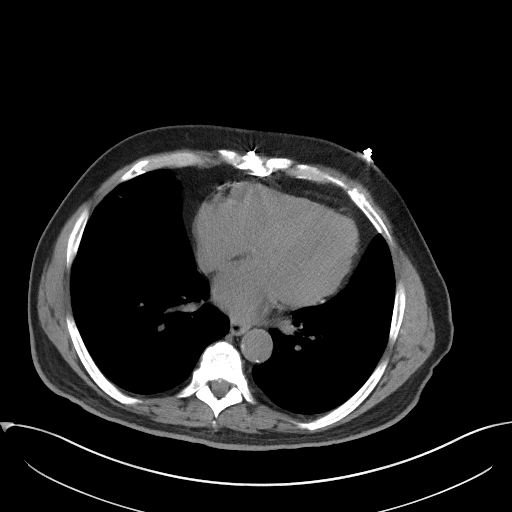

[Series 5: coronal · coronal · 0.79mm/px · 3 of 145 slices shown]
[im 49/145  soft-tissue]
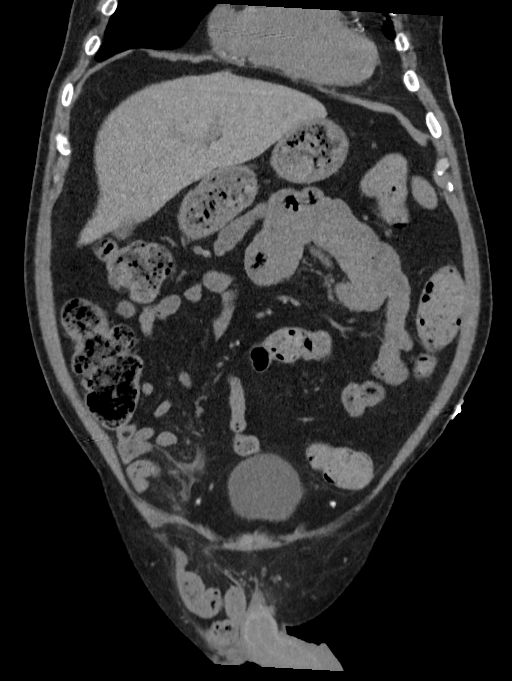
[im 65/145  soft-tissue]
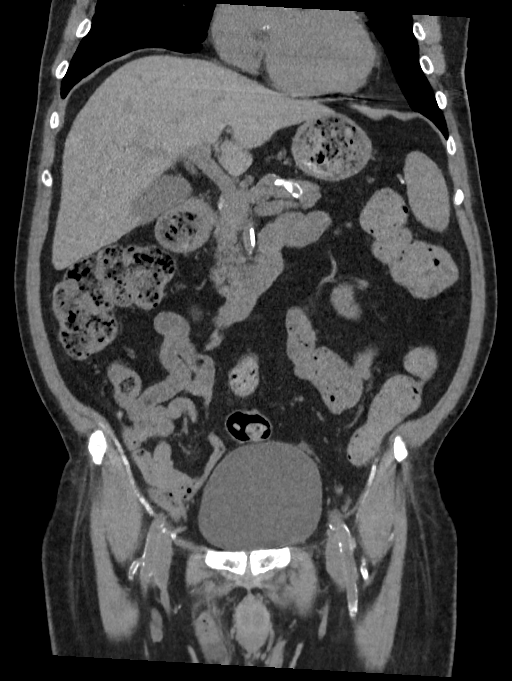
[im 81/145  soft-tissue]
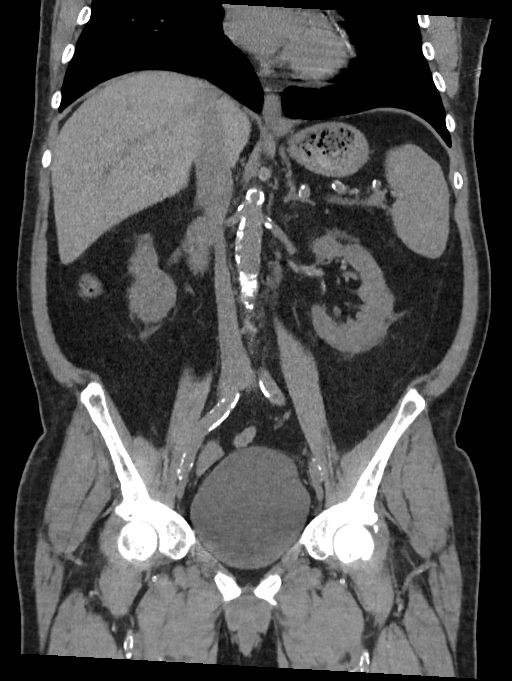

[16 of 46 positions shown; findings below may reference images not displayed]

FINDINGS: Lower chest:  Atherosclerosis with CABG.  No acute finding.

Hepatobiliary: No focal liver abnormality.Cholelithiasis without
evidence of biliary inflammation

Pancreas: Unremarkable.

Spleen: Unremarkable.

Adrenals/Urinary Tract: Negative adrenals. No hydronephrosis or
stone. Moderate distension of the bladder.

Stomach/Bowel: No obstruction. Desiccated stool seen throughout the
colon. No evidence of bowel inflammation or obstruction.

Vascular/Lymphatic: Atherosclerotic calcification. No acute vascular
finding. no mass or adenopathy.

Reproductive:No pathologic findings.

Other: No ascites or pneumoperitoneum. Large right inguinal hernia
containing nonobstructed small bowel.

Musculoskeletal: Generalized disc and facet degeneration. No acute
osseous finding.
IMPRESSION: 1. No hydronephrosis or urolithiasis.
2. Constipated appearance.
3. Cholelithiasis.
4. Large right inguinal hernia containing small bowel.

## 2020-12-06 IMAGING — US VENOUS DOPPLER ULTRASOUND OF LEFT LOWER EXTREMITY
1 series · 13 of 24 positions shown · non-contrast
Comparison: None

CLINICAL DATA: LEFT leg pain for 3 weeks; history hypertension,
diabetes mellitus, coronary artery disease



[Series 1: venous doppler ultrasound of left lower extremity · 13 of 41 slices shown]
[im 1/41]
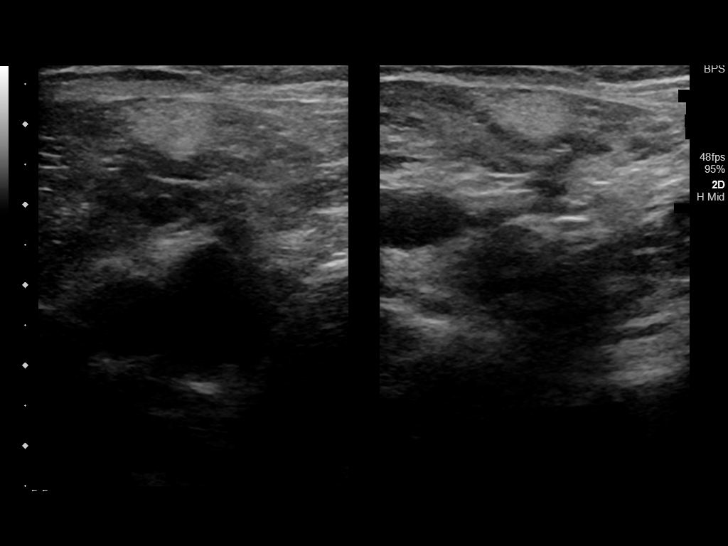
[im 4/41]
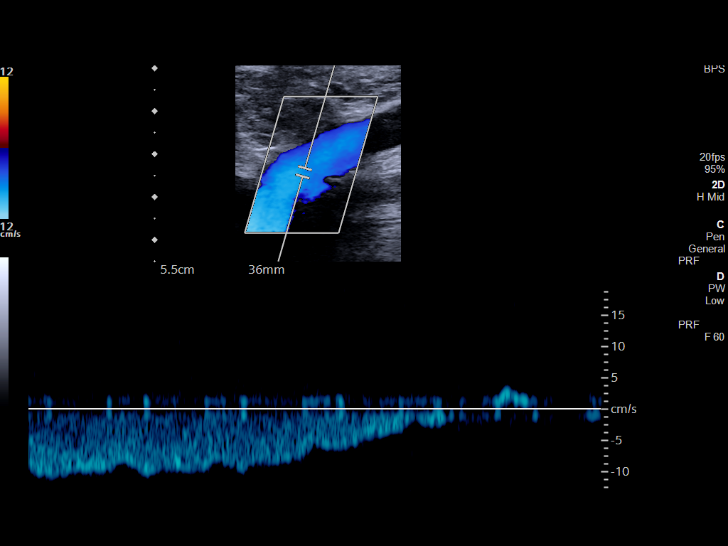
[im 7/41]
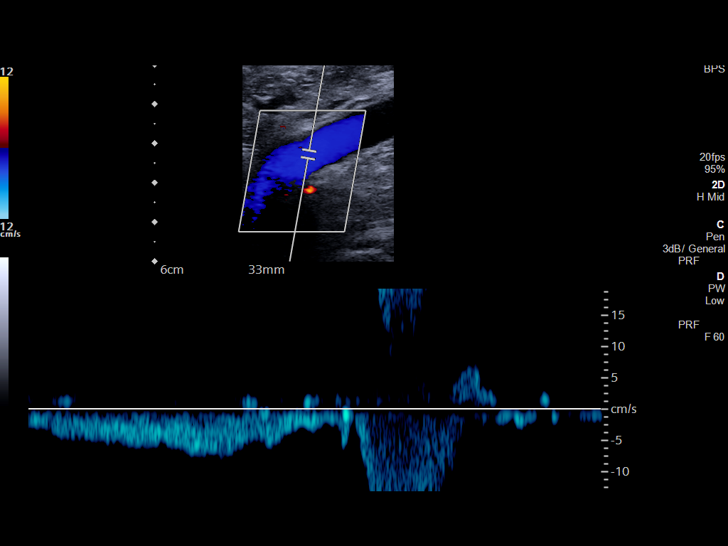
[im 11/41]
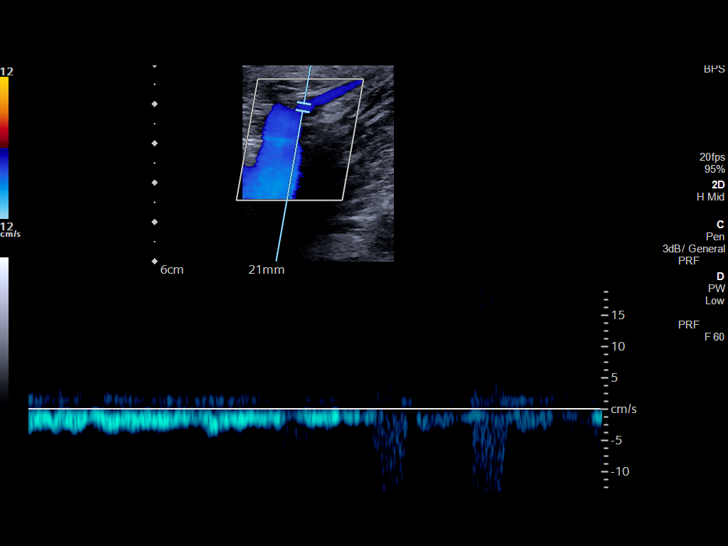
[im 14/41]
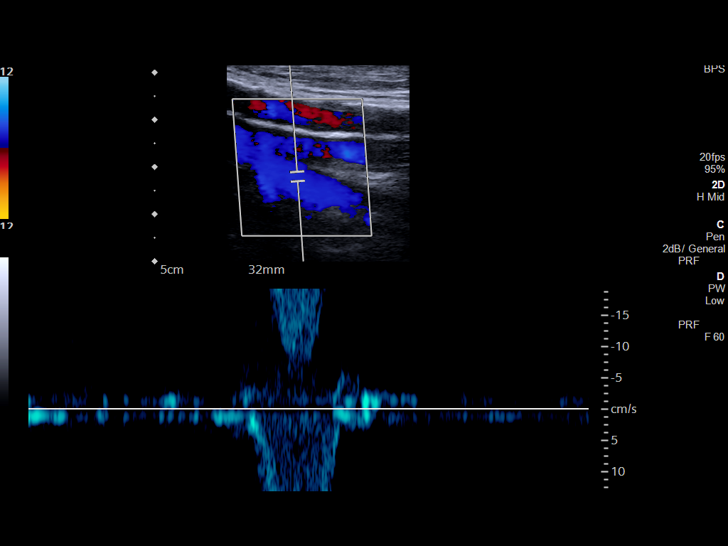
[im 18/41]
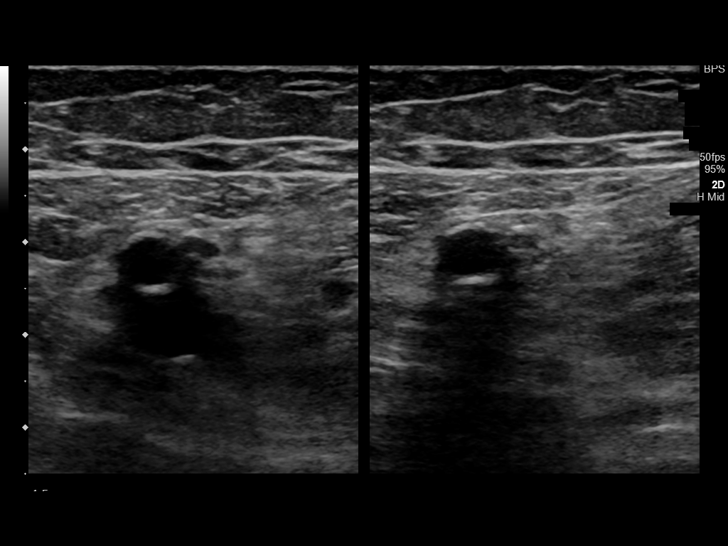
[im 21/41]
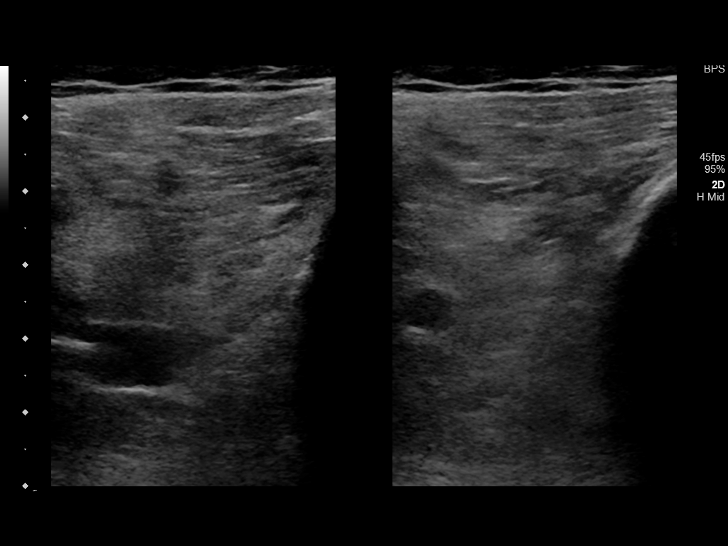
[im 23/41]
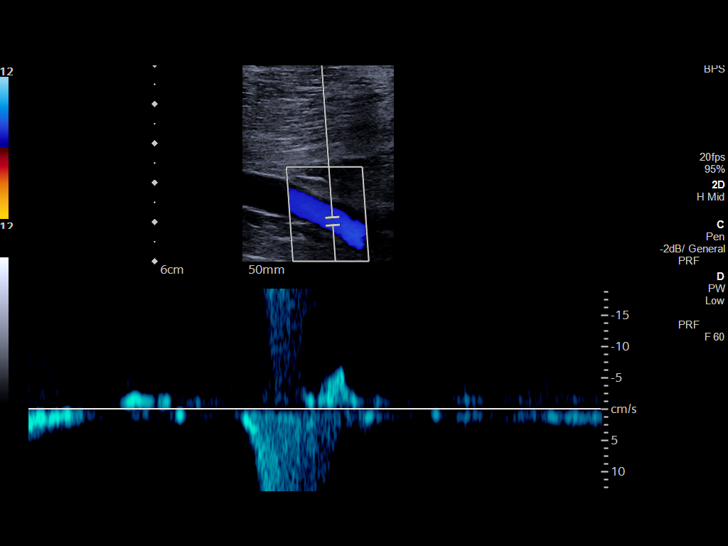
[im 27/41]
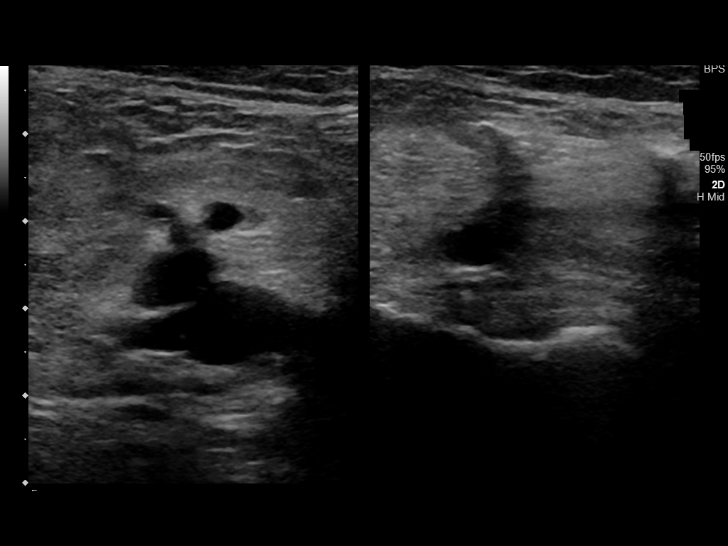
[im 30/41]
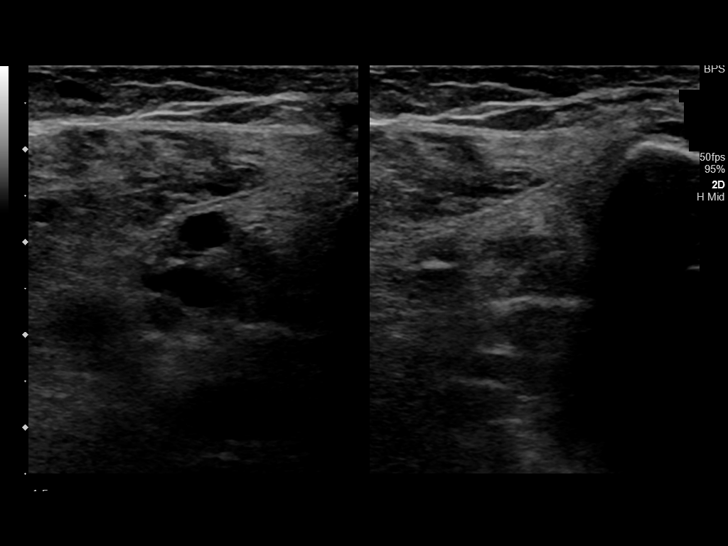
[im 34/41]
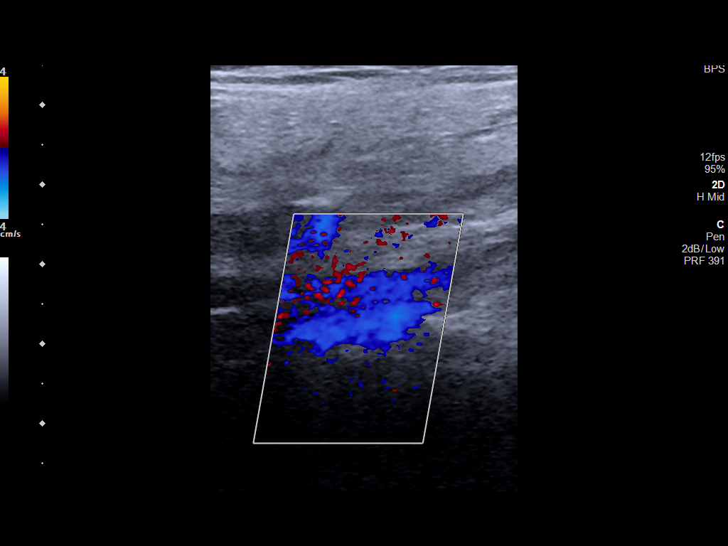
[im 37/41]
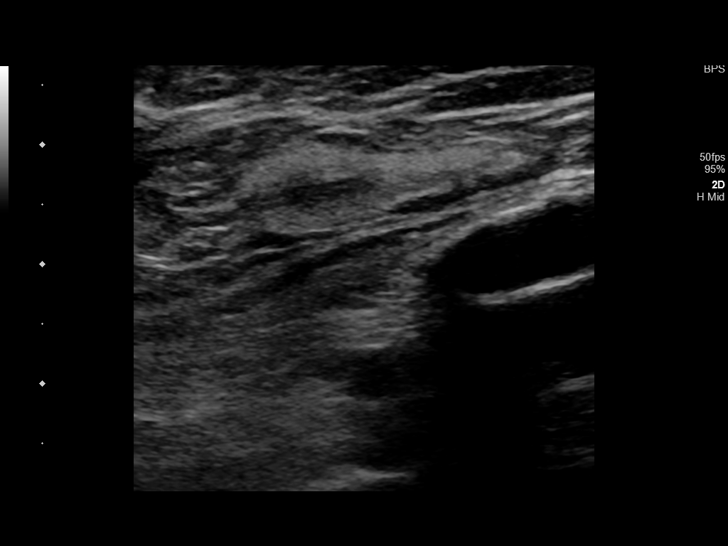
[im 41/41]
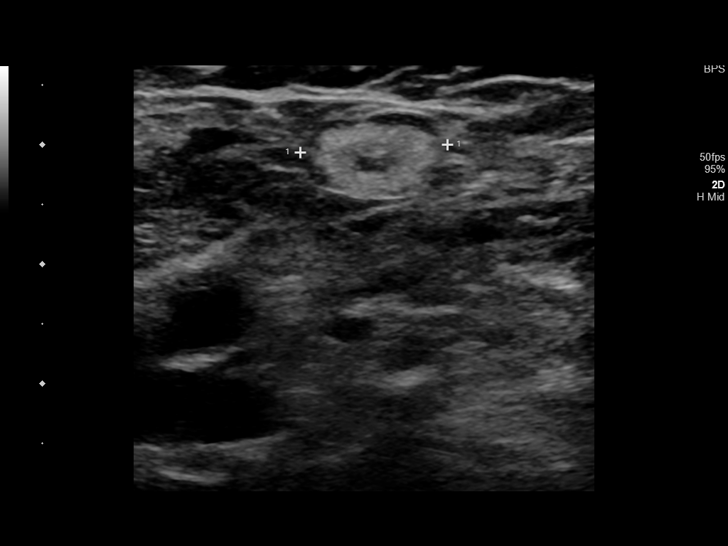

[13 of 24 positions shown; findings below may reference images not displayed]

FINDINGS: Contralateral Common Femoral Vein: Respiratory phasicity is normal
and symmetric with the symptomatic side. No evidence of thrombus.
Normal compressibility.

Common Femoral Vein: No evidence of thrombus. Normal
compressibility, respiratory phasicity and response to augmentation.

Saphenofemoral Junction: No evidence of thrombus. Normal
compressibility and flow on color Doppler imaging.

Profunda Femoral Vein: No evidence of thrombus. Normal
compressibility and flow on color Doppler imaging.

Femoral Vein: No evidence of thrombus. Normal compressibility,
respiratory phasicity and response to augmentation.

Popliteal Vein: No evidence of thrombus. Normal compressibility,
respiratory phasicity and response to augmentation.

Calf Veins: No evidence of thrombus. Normal compressibility and flow
on color Doppler imaging.

Superficial Great Saphenous Vein: No evidence of thrombus. Normal
compressibility.

Venous Reflux:  None.

Other Findings:  None.
IMPRESSION: No evidence of deep venous thrombosis in the LEFT lower extremity.

## 2021-11-27 ENCOUNTER — Emergency Department
Admission: EM | Admit: 2021-11-27 | Discharge: 2021-11-27 | Disposition: A | Discharge status: Home / Self Care | Payer: No Typology Code available for payment source | Attending: Emergency Medicine | Admitting: Emergency Medicine

## 2021-11-27 ENCOUNTER — Other Ambulatory Visit: Payer: Self-pay

## 2021-11-27 ENCOUNTER — Emergency Department: Payer: No Typology Code available for payment source

## 2021-11-27 DIAGNOSIS — K4090 Unilateral inguinal hernia, without obstruction or gangrene, not specified as recurrent: Secondary | ICD-10-CM | POA: Diagnosis not present

## 2021-11-27 DIAGNOSIS — M25552 Pain in left hip: Secondary | ICD-10-CM | POA: Insufficient documentation

## 2021-11-27 DIAGNOSIS — W19XXXA Unspecified fall, initial encounter: Secondary | ICD-10-CM | POA: Diagnosis not present

## 2021-11-27 DIAGNOSIS — G8911 Acute pain due to trauma: Secondary | ICD-10-CM | POA: Insufficient documentation

## 2021-11-27 DIAGNOSIS — E119 Type 2 diabetes mellitus without complications: Secondary | ICD-10-CM | POA: Diagnosis not present

## 2021-11-27 DIAGNOSIS — I10 Essential (primary) hypertension: Secondary | ICD-10-CM | POA: Insufficient documentation

## 2021-11-27 DIAGNOSIS — I2510 Atherosclerotic heart disease of native coronary artery without angina pectoris: Secondary | ICD-10-CM | POA: Insufficient documentation

## 2021-11-27 NOTE — ED Notes (Signed)
First Nurse Note: pt had a mechanical fall yesterday, ground level fall yesterday. Pt c/o L hip/L rib pain. Pt is on blood thinner for a fib. Denies head injury. Denies LOC. Ambulatory to trunk with assistance from EMS  18G L AC , CBG was 328 150/90 100% RA

## 2021-11-27 NOTE — ED Triage Notes (Signed)
Pt comes ems with left hip pain. Pt fell Wednesday. Has a home health nurse who suggested he come here. States the pain goes into his lower back and down his leg. Pt states mechanical fall while using walker.

## 2021-11-27 NOTE — Discharge Instructions (Signed)
Your exam and XRs are normal following your fall. You do not have any fractures or dislocations noted. Follow-up with your provider for ongoing symptoms.

## 2021-11-27 NOTE — ED Notes (Signed)
Provider evaluated pt at bedside and ordered scans.

## 2021-11-27 NOTE — ED Provider Notes (Signed)
[facility] Emergency Department Provider Note     Event Date/Time   First MD Initiated Contact with Patient 11/27/21 1348     (approximate)   History   Hip Pain   HPI  Ollen Rao is a 71 y.o. male with a history of diabetes, hypertension, CAD, presents to the ED for evaluation of pain following mechanical fall.  Patient apparently fell on Wednesday at home while using his walker.  He describes a mechanical fall leading to left hip pain.  Patient denies any head injury or LOC.  He does endorse some pain to the lower back that radiates down the left leg.  He presents to the ED today for evaluation at the advice of his home health nurse.  Patient denies any bladder or bowel incontinence, foot drop, or saddle anesthesia.     Physical Exam   Triage Vital Signs: ED Triage Vitals  Enc Vitals Group     BP 11/27/21 1306 110/68     Pulse Rate 11/27/21 1306 86     Resp 11/27/21 1306 18     Temp 11/27/21 1306 98.2 F (36.8 C)     Temp Source 11/27/21 1306 Oral     SpO2 11/27/21 1306 100 %     Weight 11/27/21 1307 164 lb (74.4 kg)     Height 11/27/21 1307 5\' 9"  (1.753 m)     Head Circumference --      Peak Flow --      Pain Score 11/27/21 1307 4     Pain Loc --      Pain Edu? --      Excl. in GC? --     Most recent vital signs: Vitals:   11/27/21 1306 11/27/21 1509  BP: 110/68 (!) 149/82  Pulse: 86 88  Resp: 18 18  Temp: 98.2 F (36.8 C)   SpO2: 100% 100%    General Awake, no distress.  CV:  Good peripheral perfusion.  RESP:  Normal effort.  ABD:  No distention.  Normoactive bowel sounds noted.  Large inguinal hernia noted on the left. MSK:  Normal spinal alignment without midline tenderness, spasm, vomiting, or step-off.  Full active range of motion to the left upper extremity without leg length discrepancy or fixed external rotation.   ED Results / Procedures / Treatments   Labs (all labs ordered are listed, but only abnormal results  are displayed) Labs Reviewed - No data to display   EKG   RADIOLOGY  I personally viewed and evaluated these images as part of my medical decision making, as well as reviewing the written report by the radiologist.  ED Provider Interpretation: no acute findings }  DG Lumbar Spine Complete  Result Date: 11/27/2021 CLINICAL DATA:  Fall, back pain EXAM: LUMBAR SPINE - COMPLETE 4+ VIEW COMPARISON:  None. FINDINGS: No acute fracture or malalignment identified. No spondylolysis identified. Mild dextroscoliosis of the lumbar spine. Facet arthropathy most severe at L5-S1. Mild to moderate intervertebral disc space narrowing throughout the lumbar spine. Arterial vascular calcifications noted. IMPRESSION: Chronic changes with no acute fracture identified. Electronically Signed   By: 11/29/2021 M.D.   On: 11/27/2021 14:28   DG Hip Unilat W or Wo Pelvis 2-3 Views Left  Result Date: 11/27/2021 CLINICAL DATA:  Fall, left hip pain EXAM: DG HIP (WITH OR WITHOUT PELVIS) 2-3V LEFT COMPARISON:  None. FINDINGS: No acute fracture or dislocation identified. Mild narrowing of the hip joint space with acetabular subchondral sclerosis and  small marginal osteophytes. No fracture visualized in the pelvis. Arterial vascular calcifications noted. Large inguinal/scrotal hernia which contains loops of bowel. IMPRESSION: 1. No acute osseous abnormality identified. 2. Large inguinal/scrotal hernia which contains loops of bowel. Electronically Signed   By: Jannifer Hick M.D.   On: 11/27/2021 14:26     PROCEDURES:  Critical Care performed: No  Procedures   MEDICATIONS ORDERED IN ED: Medications - No data to display   IMPRESSION / MDM / ASSESSMENT AND PLAN / ED COURSE  I reviewed the triage vital signs and the nursing notes.                              Differential diagnosis includes, but is not limited to, hip fracture, hip dislocation, hip contusion, lumbar strain, lumbar compression  fracture  Geriatric patient with ED evaluation of injury sustained following mechanical fall.  Patient presents in no acute distress.  Patient endorses multiple falls due to poor mobility, but does have a walker for ambulatory assistance.  Patient is evaluated for his complaints in the ED, found to have a reassuring exam.  Further evaluation with plain film images of the lumbar spine and hip/pelvis, did not reveal any acute fractures, dislocations based on my review of images.  Patient's diagnosis is consistent with fall at home with hip contusion and lumbar strain. Patient will be discharged home with instructions to take his home pain medicines as prescribed. Patient is to follow up with the [facility] as needed or otherwise directed. Patient is given ED precautions to return to the ED for any worsening or new symptoms.   FINAL CLINICAL IMPRESSION(S) / ED DIAGNOSES   Final diagnoses:  Fall, initial encounter  Acute pain of left hip  Inguinal hernia of left side without obstruction or gangrene  Scrotal hernia     Rx / DC Orders   ED Discharge Orders     None        Note:  This document was prepared using Dragon voice recognition software and may include unintentional dictation errors.    Lissa Hoard, PA-C 11/27/21 1701    Gilles Chiquito, MD 11/27/21 [phone]

## 2022-04-24 ENCOUNTER — Other Ambulatory Visit: Payer: Self-pay

## 2022-04-24 ENCOUNTER — Emergency Department
Admission: EM | Admit: 2022-04-24 | Discharge: 2022-04-24 | Disposition: A | Discharge status: Home / Self Care | Payer: No Typology Code available for payment source | Attending: Emergency Medicine | Admitting: Emergency Medicine

## 2022-04-24 ENCOUNTER — Emergency Department: Payer: No Typology Code available for payment source

## 2022-04-24 DIAGNOSIS — R531 Weakness: Secondary | ICD-10-CM | POA: Diagnosis not present

## 2022-04-24 DIAGNOSIS — E119 Type 2 diabetes mellitus without complications: Secondary | ICD-10-CM | POA: Diagnosis not present

## 2022-04-24 DIAGNOSIS — I10 Essential (primary) hypertension: Secondary | ICD-10-CM | POA: Diagnosis not present

## 2022-04-24 DIAGNOSIS — I959 Hypotension, unspecified: Secondary | ICD-10-CM

## 2022-04-24 DIAGNOSIS — I2510 Atherosclerotic heart disease of native coronary artery without angina pectoris: Secondary | ICD-10-CM | POA: Insufficient documentation

## 2022-04-24 DIAGNOSIS — E162 Hypoglycemia, unspecified: Secondary | ICD-10-CM | POA: Diagnosis present

## 2022-04-24 LAB — URINALYSIS, ROUTINE W REFLEX MICROSCOPIC
Bacteria, UA: NONE SEEN
Bilirubin Urine: NEGATIVE
Glucose, UA: >=500 mg/dL — AB
Hgb urine dipstick: NEGATIVE
Ketones, ur: NEGATIVE mg/dL
Leukocytes,Ua: NEGATIVE
Nitrite: NEGATIVE
Protein, ur: 30 mg/dL — AB
Specific Gravity, Urine: 1.018 (ref 1.005–1.030)
pH: 5 (ref 5.0–8.0)

## 2022-04-24 LAB — COMPREHENSIVE METABOLIC PANEL
ALT: 13 U/L (ref 0–44)
AST: 15 U/L (ref 15–41)
Albumin: 3.3 g/dL — ABNORMAL LOW (ref 3.5–5.0)
Alkaline Phosphatase: 50 U/L (ref 38–126)
Anion gap: 7 (ref 5–15)
BUN: 26 mg/dL — ABNORMAL HIGH (ref 8–23)
CO2: 23 mmol/L (ref 22–32)
Calcium: 8.2 mg/dL — ABNORMAL LOW (ref 8.9–10.3)
Chloride: 109 mmol/L (ref 98–111)
Creatinine, Ser: 1.82 mg/dL — ABNORMAL HIGH (ref 0.61–1.24)
GFR, Estimated: 39 mL/min — ABNORMAL LOW (ref >60)
Glucose, Bld: 294 mg/dL — ABNORMAL HIGH (ref 70–99)
Potassium: 3.5 mmol/L (ref 3.5–5.1)
Sodium: 139 mmol/L (ref 135–145)
Total Bilirubin: 0.5 mg/dL (ref 0.3–1.2)
Total Protein: 6 g/dL — ABNORMAL LOW (ref 6.5–8.1)

## 2022-04-24 LAB — CBC WITH DIFFERENTIAL/PLATELET
Abs Immature Granulocytes: 0.01 10*3/uL (ref 0.00–0.07)
Basophils Absolute: 0.1 10*3/uL (ref 0.0–0.1)
Basophils Relative: 1 %
Eosinophils Absolute: 0.3 10*3/uL (ref 0.0–0.5)
Eosinophils Relative: 6 %
HCT: 31.3 % — ABNORMAL LOW (ref 39.0–52.0)
Hemoglobin: 9.7 g/dL — ABNORMAL LOW (ref 13.0–17.0)
Immature Granulocytes: 0 %
Lymphocytes Relative: 15 %
Lymphs Abs: 0.7 10*3/uL (ref 0.7–4.0)
MCH: 24.1 pg — ABNORMAL LOW (ref 26.0–34.0)
MCHC: 31 g/dL (ref 30.0–36.0)
MCV: 77.9 fL — ABNORMAL LOW (ref 80.0–100.0)
Monocytes Absolute: 0.4 10*3/uL (ref 0.1–1.0)
Monocytes Relative: 8 %
Neutro Abs: 3.3 10*3/uL (ref 1.7–7.7)
Neutrophils Relative %: 70 %
Platelets: 135 10*3/uL — ABNORMAL LOW (ref 150–400)
RBC: 4.02 MIL/uL — ABNORMAL LOW (ref 4.22–5.81)
RDW: 15.8 % — ABNORMAL HIGH (ref 11.5–15.5)
WBC: 4.7 10*3/uL (ref 4.0–10.5)
nRBC: 0 % (ref 0.0–0.2)

## 2022-04-24 LAB — CBG MONITORING, ED
Glucose-Capillary: 287 mg/dL — ABNORMAL HIGH (ref 70–99)
Glucose-Capillary: 251 mg/dL — ABNORMAL HIGH (ref 70–99)

## 2022-04-24 LAB — TROPONIN I (HIGH SENSITIVITY)
Troponin I (High Sensitivity): 15 ng/L (ref <18)
Troponin I (High Sensitivity): 14 ng/L (ref <18)

## 2022-04-24 LAB — LACTIC ACID, PLASMA: Lactic Acid, Venous: 1.4 mmol/L (ref 0.5–1.9)

## 2022-04-24 LAB — PROCALCITONIN: Procalcitonin: <0.1 ng/mL

## 2022-04-24 MED ORDER — SODIUM CHLORIDE 0.9 % IV BOLUS
1000.0000 mL | Freq: Once | INTRAVENOUS | Status: AC
Start: 2022-04-24 — End: 2022-04-24
  Administered 2022-04-24: 1000 mL via INTRAVENOUS

## 2022-04-24 MED ORDER — IOHEXOL 300 MG/ML  SOLN
100.0000 mL | Freq: Once | INTRAMUSCULAR | Status: AC | PRN
  Administered 2022-04-24: 100 mL via INTRAVENOUS

## 2022-04-24 MED ORDER — LACTATED RINGERS IV BOLUS
1000.0000 mL | Freq: Once | INTRAVENOUS | Status: AC
  Administered 2022-04-24: 1000 mL via INTRAVENOUS

## 2022-04-24 NOTE — ED Provider Notes (Signed)
[facility] Provider Note    Event Date/Time   First MD Initiated Contact with Patient 04/24/22 [phone]     (approximate)   History   Hypotension   HPI  Colin Levine is a 71 y.o. male  with diabetes, CAD, HTN who comes in with hypoglycemia.  Patient states that he woke because his glucose monitor went off stating that his glucose was low.  He states that he is unsure if he took his long-acting insulin last night but he knows that he has not been eating as much.  He states that he did not eat lunch and only had a little soup for dinner.  He states he has not been eating because he had a lot of abdominal discomfort.  He states that he has not had a bowel movement in 7 days.  He denies being on any opioids but does report a lot of chronic pain in his legs that his doctor has told him to take Tylenol for.  He denies any severe headaches but just reports intermittent headaches.  Does not really feel confused at this time.  He states that he ambulates with a walker and lives by himself.  He states that when his sugar was low he did eat peanut butter and ate a glucose packet and when EMS got there they noted that his blood pressure was low as well.  He denies any chest pain or shortness of breath.      Physical Exam   Triage Vital Signs: ED Triage Vitals  Enc Vitals Group     BP 04/24/22 0515 (!) 84/53     Pulse Rate 04/24/22 0515 66     Resp 04/24/22 0515 18     Temp 04/24/22 0515 97.6 F (36.4 C)     Temp Source 04/24/22 0515 Oral     SpO2 04/24/22 0515 98 %     Weight 04/24/22 0516 155 lb (70.3 kg)     Height 04/24/22 0516 5\' 9"  (1.753 m)     Head Circumference --      Peak Flow --      Pain Score 04/24/22 0516 8     Pain Loc --      Pain Edu? --      Excl. in GC? --     Most recent vital signs: Vitals:   04/24/22 0515 04/24/22 0613  BP: (!) 84/53 (!) 104/57  Pulse: 66   Resp: 18   Temp: 97.6 F (36.4 C)   SpO2: 98%      General: Awake, no  distress.  CV:  Good peripheral perfusion.  Resp:  Normal effort.  Abd:  No distention.  Other:  Patient has large right inguinal hernia.  He is tender throughout his abdomen Patient is alert and oriented x2.  He does not know the year.  He has difficulty lifting up his legs secondary to chronic pain.  His grip strength seems equal overall.  No obvious deficits   ED Results / Procedures / Treatments   Labs (all labs ordered are listed, but only abnormal results are displayed) Labs Reviewed  COMPREHENSIVE METABOLIC PANEL - Abnormal; Notable for the following components:      Result Value   Glucose, Bld 294 (*)    BUN 26 (*)    Creatinine, Ser 1.82 (*)    Calcium 8.2 (*)    Total Protein 6.0 (*)    Albumin 3.3 (*)    GFR, Estimated 39 (*)  All other components within normal limits  CBC WITH DIFFERENTIAL/PLATELET - Abnormal; Notable for the following components:   RBC 4.02 (*)    Hemoglobin 9.7 (*)    HCT 31.3 (*)    MCV 77.9 (*)    MCH 24.1 (*)    RDW 15.8 (*)    Platelets 135 (*)    All other components within normal limits  CBG MONITORING, ED - Abnormal; Notable for the following components:   Glucose-Capillary 287 (*)    All other components within normal limits  CULTURE, BLOOD (ROUTINE X 2)  CULTURE, BLOOD (ROUTINE X 2)  URINE CULTURE  LACTIC ACID, PLASMA  URINALYSIS, ROUTINE W REFLEX MICROSCOPIC  LACTIC ACID, PLASMA  PROCALCITONIN  TROPONIN I (HIGH SENSITIVITY)  TROPONIN I (HIGH SENSITIVITY)     EKG  My interpretation of EKG:  Normal sinus rhythm 71 without any ST elevation little ST depression in V6, left posterior fascicular block, no T wave inversions.  No prior EKG to compare to.  EKG sinus rate of 85 without any ST elevation but does have some T wave inversions in the inferior lateral lead, left posterior fascicular block.  According to the nurse she did this EKG she had to move the leads around given they were originally set up wrong    RADIOLOGY I  have reviewed the chest xray personally and interpreted and no evidence of any pneumonia  PROCEDURES:  Critical Care performed: No  .1-3 Lead EKG Interpretation  Performed by: Concha Se, MD Authorized by: Concha Se, MD     Interpretation: normal     ECG rate:  70   ECG rate assessment: normal     Rhythm: sinus rhythm     Ectopy: none     Conduction: normal      MEDICATIONS ORDERED IN ED: Medications  sodium chloride 0.9 % bolus 1,000 mL (0 mLs Intravenous Stopped 04/24/22 0754)  lactated ringers bolus 1,000 mL (0 mLs Intravenous Stopped 04/24/22 0938)  iohexol (OMNIPAQUE) 300 MG/ML solution 100 mL (100 mLs Intravenous Contrast Given 04/24/22 0803)     IMPRESSION / MDM / ASSESSMENT AND PLAN / ED COURSE  I reviewed the triage vital signs and the nursing notes.   Patient's presentation is most consistent with acute presentation with potential threat to life or bodily function.   Patient comes in alert and oriented x2 with low blood sugars and hypotension in the setting of decreased appetite as well as abdominal pain.  CT head ordered to evaluate for any intercranial mass CT abdomen evaluate for any obstruction given he has a large right inguinal hernia I suspect this all could just be constipation.  Patient's blood pressure has come up with fluids ordered prior to me seeing the patient.  Patient's sugars have been stable over the past few hours.  His urine without evidence of UTI.  Troponins are negative x2 even though he did have some EKG changes he denies any chest pain or shortness of breath and has a prior CABG I wonder if this is just his baseline.  His lactate is normal his CMP shows stably slightly elevated creatinine.  His hemoglobin is around baseline at 9.7 and procalcitonin is negative significance of any infection.  His CT imaging is reassuring does have a right inguinal hernia but no evidence of obstruction or incarceration.  He is got some evidence of distended  bladder so we will get a bladder scan  Patient is aware of his right testicle hernia but  there is no evidence of any obstruction or incarceration and he is working on trying to get surgery but he needs his sugars better control before they will do that.  11:05 AM offered to call patient's family member to update them on these results but he prefer that I just write in the discharge so he can give it to his primary care doctor.  We had extensive discussion about his abnormal EKG but he continues to deny any chest pain or shortness of breath had prior CABG so this could just be baseline for him.  Has had negative troponins x2.  We offered and discussed admission but he stated he would much prefer to go home.  There is also concern for potential urinary retention on CT imaging with 400 cc.  Patient reports that he has been urinating here and since the CT imaging.  I recommend that we try to urinate and do another bladder scan but he is declining.  He states that he does not want a Foley at this time he still able to urinate and he would rather follow-up with his primary care doctor and I recommended that he get referred to urologist.  He expressed understanding and felt comfortable with this plan.  Patient is continue to request discharge home given patient's no longer hypotensive I suspect that this is more likely related to dehydration given the low sugar and him stating that he had not been eating and drinking well yesterday.  Given this we will put patient up for discharge and he can return to the ER if symptoms are changing or any other concerns.  We discussed holding his long-acting insulin tomorrow and is using his insulin as needed with meals and if his sugars are maintaining that he can take his long-acting insulin tomorrow as long as he is eating a full diet  The patient is on the cardiac monitor to evaluate for evidence of arrhythmia and/or significant heart rate changes.      FINAL CLINICAL  IMPRESSION(S) / ED DIAGNOSES   Final diagnoses:  Hypoglycemia  Weakness  Hypotension, unspecified hypotension type     Rx / DC Orders   ED Discharge Orders     None        Note:  This document was prepared using Dragon voice recognition software and may include unintentional dictation errors.   Concha Se, MD 04/24/22 [phone]

## 2022-04-24 NOTE — ED Notes (Signed)
Pt wheeled out of department to daughter's car.

## 2022-04-24 NOTE — ED Notes (Signed)
Pt's daughter called by Licensed conveyancer.

## 2022-04-24 NOTE — ED Notes (Signed)
Patient transported to CT 

## 2022-04-24 NOTE — ED Triage Notes (Addendum)
Pt presents to ER via ems from home with initial c/o hypoglycemia per ems.  Pt states his CBG was reading around 50, so pt ate peanut butter, which brought his sugar back up.  Ems reported pt was hypotensive en route, with BP of appx 80/40.  Pt also states he has been constipated and has not had BM in last 6 days.  Pt given 1000 cc NS with ems.  18G IV to right hand.  Pt is A&O x4 and in NAD in triage.

## 2022-04-24 NOTE — Discharge Instructions (Addendum)
Your EKG was slightly abnormal but I do not have prior EKGs to compare to but if you develop any chest pain or shortness of breath need to return to the ER immediately for repeat evaluation.  Otherwise you can call your cardiologist to make a follow-up  Your CT scan showed some question of urinary retention.  We recommended you trying to urinate here and seeing if you are retaining to see if we need to place a Foley.  You plan that you are still able to urinate and you have urinated multiple times here and you are declining Foley placement would rather follow-up with your primary care doctor.  They should refer you to a urologist at the [facility] and if you ever develop inability to urinate you need to return to the ER immediately for emergent Foley placement as this can be life-threatening.  Your sugar was corrected and for today just take your insulin as needed with meals and if your sugars were staying high throughout the day you can take your long-acting insulin tomorrow morning.  Keep a close eye's's eye on your sugars and call your primary doctor for follow-up in 1 to 2 days    1. Progressed and now large bowel containing right inguinal hernia.  But no secondary obstruction or evidence of incarceration.    2. Distended urinary bladder (485 mL), query urinary retention. No  obstructive uropathy. Progressed cardiomegaly since 2020.  Chronic cholelithiasis.

## 2022-04-24 NOTE — ED Notes (Signed)
Lab made aware of additional labs.

## 2022-04-24 NOTE — ED Notes (Signed)
Pt is unsure how he can get home.  Sts he needs his wheelchair.  Unit secretary trying to contact family.

## 2022-04-25 LAB — URINE CULTURE: Culture: NO GROWTH

## 2022-04-29 LAB — CULTURE, BLOOD (ROUTINE X 2)
Culture: NO GROWTH
Culture: NO GROWTH
Special Requests: ADEQUATE
Special Requests: ADEQUATE

## 2022-06-13 ENCOUNTER — Other Ambulatory Visit: Payer: Self-pay

## 2022-06-13 ENCOUNTER — Emergency Department
Admission: EM | Admit: 2022-06-13 | Discharge: 2022-06-13 | Disposition: A | Discharge status: Home / Self Care | Payer: No Typology Code available for payment source | Attending: Emergency Medicine | Admitting: Emergency Medicine

## 2022-06-13 ENCOUNTER — Emergency Department: Payer: No Typology Code available for payment source

## 2022-06-13 DIAGNOSIS — K5900 Constipation, unspecified: Secondary | ICD-10-CM | POA: Insufficient documentation

## 2022-06-13 DIAGNOSIS — R339 Retention of urine, unspecified: Secondary | ICD-10-CM | POA: Diagnosis present

## 2022-06-13 LAB — URINALYSIS, ROUTINE W REFLEX MICROSCOPIC
Bilirubin Urine: NEGATIVE
Glucose, UA: >=500 mg/dL — AB
Hgb urine dipstick: NEGATIVE
Ketones, ur: NEGATIVE mg/dL
Leukocytes,Ua: NEGATIVE
Nitrite: NEGATIVE
Protein, ur: 100 mg/dL — AB
Specific Gravity, Urine: 1.017 (ref 1.005–1.030)
pH: 6 (ref 5.0–8.0)

## 2022-06-13 LAB — CBC
HCT: 33.3 % — ABNORMAL LOW (ref 39.0–52.0)
Hemoglobin: 10.2 g/dL — ABNORMAL LOW (ref 13.0–17.0)
MCH: 24.1 pg — ABNORMAL LOW (ref 26.0–34.0)
MCHC: 30.6 g/dL (ref 30.0–36.0)
MCV: 78.5 fL — ABNORMAL LOW (ref 80.0–100.0)
Platelets: 189 10*3/uL (ref 150–400)
RBC: 4.24 MIL/uL (ref 4.22–5.81)
RDW: 14.8 % (ref 11.5–15.5)
WBC: 9.3 10*3/uL (ref 4.0–10.5)
nRBC: 0 % (ref 0.0–0.2)

## 2022-06-13 LAB — COMPREHENSIVE METABOLIC PANEL
ALT: 15 U/L (ref 0–44)
AST: 14 U/L — ABNORMAL LOW (ref 15–41)
Albumin: 3.6 g/dL (ref 3.5–5.0)
Alkaline Phosphatase: 59 U/L (ref 38–126)
Anion gap: 9 (ref 5–15)
BUN: 28 mg/dL — ABNORMAL HIGH (ref 8–23)
CO2: 27 mmol/L (ref 22–32)
Calcium: 8.2 mg/dL — ABNORMAL LOW (ref 8.9–10.3)
Chloride: 98 mmol/L (ref 98–111)
Creatinine, Ser: 1.72 mg/dL — ABNORMAL HIGH (ref 0.61–1.24)
GFR, Estimated: 41 mL/min — ABNORMAL LOW (ref >60)
Glucose, Bld: 201 mg/dL — ABNORMAL HIGH (ref 70–99)
Potassium: 4.2 mmol/L (ref 3.5–5.1)
Sodium: 134 mmol/L — ABNORMAL LOW (ref 135–145)
Total Bilirubin: 0.9 mg/dL (ref 0.3–1.2)
Total Protein: 6.7 g/dL (ref 6.5–8.1)

## 2022-06-13 LAB — LIPASE, BLOOD: Lipase: 36 U/L (ref 11–51)

## 2022-06-13 MED ORDER — LIDOCAINE HCL URETHRAL/MUCOSAL 2 % EX GEL
1.0000 | Freq: Once | CUTANEOUS | Status: AC
  Administered 2022-06-13: 1 via URETHRAL
  Filled 2022-06-13: qty 10

## 2022-06-13 MED ORDER — SORBITOL 70 % SOLN: 960.0000 mL | TOPICAL_OIL | Freq: Once | ORAL | Status: DC

## 2022-06-13 MED ORDER — POLYETHYLENE GLYCOL 3350 17 G PO PACK
34.0000 g | PACK | Freq: Once | ORAL | Status: AC
Start: 2022-06-13 — End: 2022-06-13
  Administered 2022-06-13: 34 g via ORAL
  Filled 2022-06-13: qty 2

## 2022-06-13 NOTE — Discharge Instructions (Addendum)
Use MiraLAX, or its generic equivalent, for your constipation.  Mix this white powder in your favorite noncarbonated drink, such as milk, water or juice.  To begin with and to get things moving, use 2 capfuls 1-2 times per day.  Once you are passing more regular bowel movements, cut back to about 1 capful once per day.  It is safe to use suppositories or enemas with this medication.  Please stay hydrated and drink plenty of fluids while you are taking this medication.  Please reach out to the clinic of Dr. Apolinar Junes, local urologist, to be seen in the clinic to have your Foley catheter removed.

## 2022-06-13 NOTE — ED Notes (Signed)
Pt stated that he felt liek he could have a BM and was walked to toilet by this RN and Advertising copywriter. Pt instructed to pull cord when ready to get up.

## 2022-06-13 NOTE — ED Notes (Signed)
Colin Levine was called for the patient for transportation back home. Patient will be self pay.

## 2022-06-13 NOTE — ED Notes (Signed)
Pt teaching completed on foley bag operation.  Pt able to teach back multiple times and states that  he feels comfortable going home with foley catheter.  Pt assisted to toilet at this time to try another BM. Instructed to pull cord when ready to get up.

## 2022-06-13 NOTE — ED Triage Notes (Signed)
Pt presents to ER with c/o constipation and difficulty urinating.  Pt states last urination was yesterday around 0700 and also states he has not had BM for last 3-4 days.  Pt states he takes tramadol at home regularly.  Pt denies n/v, and states he has pain in lower abdomen.  Pt is A&O x4 at this time in NAD in triage.

## 2022-06-13 NOTE — ED Provider Notes (Signed)
[facility] Provider Note    Event Date/Time   First MD Initiated Contact with Patient 06/13/22 0411     (approximate)   History   Constipation   HPI  Colin Levine is a 71 y.o. male who presents to the ED for evaluation of Constipation   Patient presents to the ED for evaluation of constipation and urinary retention.  He reports symptoms have been progressively worsening with the past few days where he has had difficulty voiding and stooling.  Reports his last meaningful bowel movement was 4 days ago.  Was able to void a small amount this morning, nearly 24 hours ago, but unable to get more than a couple dribbles out.  No fevers, emesis.  Reports some bloating and discomfort without discrete abdominal pain.   Physical Exam   Triage Vital Signs: ED Triage Vitals  Enc Vitals Group     BP 06/13/22 0119 130/67     Pulse Rate 06/13/22 0119 61     Resp 06/13/22 0119 16     Temp 06/13/22 0119 98.5 F (36.9 C)     Temp Source 06/13/22 0119 Oral     SpO2 06/13/22 0119 100 %     Weight 06/13/22 0119 155 lb (70.3 kg)     Height 06/13/22 0119 5\' 9"  (1.753 m)     Head Circumference --      Peak Flow --      Pain Score 06/13/22 0127 7     Pain Loc --      Pain Edu? --      Excl. in GC? --     Most recent vital signs: Vitals:   06/13/22 0500 06/13/22 0607  BP: (!) 208/107 (!) 197/94  Pulse: 90 87  Resp: 19 16  Temp:  98 F (36.7 C)  SpO2: 100% 100%    General: Awake, no distress.  Pleasant and conversational. CV:  Good peripheral perfusion.  Resp:  Normal effort.  Abd:  Mild and diffuse lower abdominal tenderness without peritoneal features. MSK:  No deformity noted.  Neuro:  No focal deficits appreciated. Other:     ED Results / Procedures / Treatments   Labs (all labs ordered are listed, but only abnormal results are displayed) Labs Reviewed  CBC - Abnormal; Notable for the following components:      Result Value   Hemoglobin 10.2 (*)     HCT 33.3 (*)    MCV 78.5 (*)    MCH 24.1 (*)    All other components within normal limits  COMPREHENSIVE METABOLIC PANEL - Abnormal; Notable for the following components:   Sodium 134 (*)    Glucose, Bld 201 (*)    BUN 28 (*)    Creatinine, Ser 1.72 (*)    Calcium 8.2 (*)    AST 14 (*)    GFR, Estimated 41 (*)    All other components within normal limits  URINALYSIS, ROUTINE W REFLEX MICROSCOPIC - Abnormal; Notable for the following components:   Color, Urine YELLOW (*)    APPearance CLEAR (*)    Glucose, UA >=500 (*)    Protein, ur 100 (*)    Bacteria, UA RARE (*)    All other components within normal limits  LIPASE, BLOOD    EKG   RADIOLOGY KUB interpreted by me with constipation but no SBO  Official radiology report(s): DG Abdomen 1 View  Result Date: 06/13/2022 CLINICAL DATA:  Constipation EXAM: ABDOMEN - 1 VIEW COMPARISON:  None Available. FINDINGS: Moderate stool seen throughout the colon and within the rectal vault. Normal abdominal gas pattern. No free intraperitoneal gas. Degenerative changes are noted within hips and the lumbar spine which demonstrates mild dextroscoliosis. Vascular calcifications noted. IMPRESSION: Moderate colonic stool burden.  No evidence of obstruction. Electronically Signed   By: Helyn Numbers M.D.   On: 06/13/2022 02:15    PROCEDURES and INTERVENTIONS:  Procedures  Medications  sorbitol, milk of mag, mineral oil, glycerin (SMOG) enema (has no administration in time range)  lidocaine (XYLOCAINE) 2 % jelly 1 Application (1 Application Urethral Given 06/13/22 0434)  polyethylene glycol (MIRALAX / GLYCOLAX) packet 34 g (34 g Oral Given 06/13/22 0452)     IMPRESSION / MDM / ASSESSMENT AND PLAN / ED COURSE  I reviewed the triage vital signs and the nursing notes.  Differential diagnosis includes, but is not limited to, SBO, cystitis, BPH, sepsis, constipation, foreign body  {Patient presents with symptoms of an acute illness or injury that  is potentially life-threatening.  71 year old male presents to the ED with a few days of progressively worsening constipation and urinary retention requiring Foley catheter placement and initiation of of laxatives, but ultimately suitable for outpatient management.  No peritoneal abdominal tenderness.  KUB without signs of SBO or perforation.  Blood work with CKD around baseline and mild microcytic anemia.  After Foley catheter is placed, UA without infectious features.  He begins passing stool in the ED after this and is ultimately suitable for outpatient management.  We discussed MiraLAX regimen at home, provided urology follow-up regarding his Foley catheter he is noted to have labile blood pressures and intermittently hypertensive.  He is asymptomatic after decompression of his bladder and passing some stool, has no other signs of endorgan damage, and doubt hypertensive emergency.  I considered observation admission for this patient.  Clinical Course as of 06/13/22 0631  Mon Jun 13, 2022  1610 >999 in bladder, patient unable to void, I recommended an indwelling Foley catheter and he is agreeable. [DS]  9604 Reassessed.  Feeling better with his bladder drained. [DS]  5409 Reassessed.  Feeling much better.  Has passed some stool and is appreciative.  We discussed adherence to his medications at home, MiraLAX regimen and return precautions.  Discussed following up with urology regarding his Foley catheter. [DS]    Clinical Course User Index [DS] Delton Prairie, MD     FINAL CLINICAL IMPRESSION(S) / ED DIAGNOSES   Final diagnoses:  Constipation, unspecified constipation type  Urinary retention     Rx / DC Orders   ED Discharge Orders     None        Note:  This document was prepared using Dragon voice recognition software and may include unintentional dictation errors.   Delton Prairie, MD 06/13/22 [phone]

## 2022-06-13 NOTE — ED Triage Notes (Signed)
FIRST NURSE NOTE:  Pt arrived via [facility] from home, c/o constipation, took laxatives around 7pm last night. Pt ambulatory with EMS

## 2022-06-13 NOTE — ED Notes (Signed)
Pt ambulatory to registration desk, asking about wait times, pt given update.

## 2022-10-13 ENCOUNTER — Inpatient Hospital Stay: Payer: No Typology Code available for payment source | Admitting: General Practice

## 2022-10-13 ENCOUNTER — Inpatient Hospital Stay
Admission: EM | Admit: 2022-10-13 | Discharge: 2022-11-10 | DRG: 329 | Disposition: E | Payer: No Typology Code available for payment source | Attending: Internal Medicine | Admitting: Internal Medicine

## 2022-10-13 ENCOUNTER — Other Ambulatory Visit: Payer: Self-pay

## 2022-10-13 ENCOUNTER — Encounter: Admission: EM | Disposition: E | Payer: Self-pay | Source: Home / Self Care | Attending: Internal Medicine

## 2022-10-13 ENCOUNTER — Emergency Department: Payer: No Typology Code available for payment source

## 2022-10-13 ENCOUNTER — Inpatient Hospital Stay: Payer: No Typology Code available for payment source

## 2022-10-13 DIAGNOSIS — Z832 Family history of diseases of the blood and blood-forming organs and certain disorders involving the immune mechanism: Secondary | ICD-10-CM | POA: Diagnosis not present

## 2022-10-13 DIAGNOSIS — K55069 Acute infarction of intestine, part and extent unspecified: Secondary | ICD-10-CM | POA: Diagnosis present

## 2022-10-13 DIAGNOSIS — E872 Acidosis, unspecified: Secondary | ICD-10-CM | POA: Diagnosis present

## 2022-10-13 DIAGNOSIS — Z95828 Presence of other vascular implants and grafts: Secondary | ICD-10-CM

## 2022-10-13 DIAGNOSIS — R188 Other ascites: Secondary | ICD-10-CM | POA: Diagnosis present

## 2022-10-13 DIAGNOSIS — I10 Essential (primary) hypertension: Secondary | ICD-10-CM | POA: Diagnosis present

## 2022-10-13 DIAGNOSIS — L89156 Pressure-induced deep tissue damage of sacral region: Secondary | ICD-10-CM | POA: Diagnosis present

## 2022-10-13 DIAGNOSIS — N170 Acute kidney failure with tubular necrosis: Secondary | ICD-10-CM | POA: Diagnosis present

## 2022-10-13 DIAGNOSIS — R4182 Altered mental status, unspecified: Principal | ICD-10-CM

## 2022-10-13 DIAGNOSIS — Z79890 Hormone replacement therapy (postmenopausal): Secondary | ICD-10-CM

## 2022-10-13 DIAGNOSIS — N2589 Other disorders resulting from impaired renal tubular function: Secondary | ICD-10-CM

## 2022-10-13 DIAGNOSIS — M6282 Rhabdomyolysis: Secondary | ICD-10-CM | POA: Diagnosis present

## 2022-10-13 DIAGNOSIS — L89306 Pressure-induced deep tissue damage of unspecified buttock: Secondary | ICD-10-CM | POA: Diagnosis present

## 2022-10-13 DIAGNOSIS — I2510 Atherosclerotic heart disease of native coronary artery without angina pectoris: Secondary | ICD-10-CM | POA: Diagnosis present

## 2022-10-13 DIAGNOSIS — Z1152 Encounter for screening for COVID-19: Secondary | ICD-10-CM | POA: Diagnosis not present

## 2022-10-13 DIAGNOSIS — E1311 Other specified diabetes mellitus with ketoacidosis with coma: Secondary | ICD-10-CM

## 2022-10-13 DIAGNOSIS — Z515 Encounter for palliative care: Secondary | ICD-10-CM

## 2022-10-13 DIAGNOSIS — E1110 Type 2 diabetes mellitus with ketoacidosis without coma: Secondary | ICD-10-CM | POA: Diagnosis present

## 2022-10-13 DIAGNOSIS — Z66 Do not resuscitate: Secondary | ICD-10-CM | POA: Diagnosis present

## 2022-10-13 DIAGNOSIS — E1151 Type 2 diabetes mellitus with diabetic peripheral angiopathy without gangrene: Secondary | ICD-10-CM | POA: Diagnosis present

## 2022-10-13 DIAGNOSIS — K460 Unspecified abdominal hernia with obstruction, without gangrene: Secondary | ICD-10-CM

## 2022-10-13 DIAGNOSIS — Z833 Family history of diabetes mellitus: Secondary | ICD-10-CM

## 2022-10-13 DIAGNOSIS — N179 Acute kidney failure, unspecified: Secondary | ICD-10-CM | POA: Insufficient documentation

## 2022-10-13 DIAGNOSIS — R7989 Other specified abnormal findings of blood chemistry: Secondary | ICD-10-CM | POA: Diagnosis present

## 2022-10-13 DIAGNOSIS — Z888 Allergy status to other drugs, medicaments and biological substances status: Secondary | ICD-10-CM

## 2022-10-13 DIAGNOSIS — G9341 Metabolic encephalopathy: Secondary | ICD-10-CM | POA: Diagnosis present

## 2022-10-13 DIAGNOSIS — Z7982 Long term (current) use of aspirin: Secondary | ICD-10-CM

## 2022-10-13 DIAGNOSIS — K4030 Unilateral inguinal hernia, with obstruction, without gangrene, not specified as recurrent: Principal | ICD-10-CM | POA: Diagnosis present

## 2022-10-13 DIAGNOSIS — R400 Somnolence: Secondary | ICD-10-CM | POA: Diagnosis present

## 2022-10-13 DIAGNOSIS — R5383 Other fatigue: Secondary | ICD-10-CM | POA: Diagnosis present

## 2022-10-13 DIAGNOSIS — I2489 Other forms of acute ischemic heart disease: Secondary | ICD-10-CM | POA: Diagnosis present

## 2022-10-13 DIAGNOSIS — Z79899 Other long term (current) drug therapy: Secondary | ICD-10-CM | POA: Diagnosis not present

## 2022-10-13 DIAGNOSIS — Z951 Presence of aortocoronary bypass graft: Secondary | ICD-10-CM | POA: Diagnosis not present

## 2022-10-13 DIAGNOSIS — R100 Acute abdomen: Secondary | ICD-10-CM | POA: Diagnosis present

## 2022-10-13 DIAGNOSIS — Z9911 Dependence on respirator [ventilator] status: Secondary | ICD-10-CM

## 2022-10-13 DIAGNOSIS — Z809 Family history of malignant neoplasm, unspecified: Secondary | ICD-10-CM | POA: Diagnosis not present

## 2022-10-13 DIAGNOSIS — Z7984 Long term (current) use of oral hypoglycemic drugs: Secondary | ICD-10-CM

## 2022-10-13 DIAGNOSIS — K56609 Unspecified intestinal obstruction, unspecified as to partial versus complete obstruction: Secondary | ICD-10-CM | POA: Diagnosis present

## 2022-10-13 DIAGNOSIS — Z794 Long term (current) use of insulin: Secondary | ICD-10-CM | POA: Diagnosis not present

## 2022-10-13 HISTORY — PX: LAPAROTOMY: SHX154

## 2022-10-13 HISTORY — PX: INGUINAL HERNIA REPAIR: SHX194

## 2022-10-13 HISTORY — PX: APPLICATION OF WOUND VAC: SHX5189

## 2022-10-13 HISTORY — PX: BOWEL RESECTION: SHX1257

## 2022-10-13 LAB — CBC WITH DIFFERENTIAL/PLATELET
Abs Immature Granulocytes: 0.18 10*3/uL — ABNORMAL HIGH (ref 0.00–0.07)
Abs Immature Granulocytes: 0.07 10*3/uL (ref 0.00–0.07)
Basophils Absolute: 0.1 10*3/uL (ref 0.0–0.1)
Basophils Absolute: 0 10*3/uL (ref 0.0–0.1)
Basophils Relative: 0 %
Basophils Relative: 0 %
Eosinophils Absolute: 0 10*3/uL (ref 0.0–0.5)
Eosinophils Absolute: 0 10*3/uL (ref 0.0–0.5)
Eosinophils Relative: 0 %
Eosinophils Relative: 0 %
HCT: 41 % (ref 39.0–52.0)
HCT: 31 % — ABNORMAL LOW (ref 39.0–52.0)
Hemoglobin: 11.9 g/dL — ABNORMAL LOW (ref 13.0–17.0)
Hemoglobin: 9.3 g/dL — ABNORMAL LOW (ref 13.0–17.0)
Immature Granulocytes: 1 %
Immature Granulocytes: 1 %
Lymphocytes Relative: 3 %
Lymphocytes Relative: 3 %
Lymphs Abs: 0.7 10*3/uL (ref 0.7–4.0)
Lymphs Abs: 0.3 10*3/uL — ABNORMAL LOW (ref 0.7–4.0)
MCH: 21.7 pg — ABNORMAL LOW (ref 26.0–34.0)
MCH: 21.8 pg — ABNORMAL LOW (ref 26.0–34.0)
MCHC: 29 g/dL — ABNORMAL LOW (ref 30.0–36.0)
MCHC: 30 g/dL (ref 30.0–36.0)
MCV: 74.7 fL — ABNORMAL LOW (ref 80.0–100.0)
MCV: 72.6 fL — ABNORMAL LOW (ref 80.0–100.0)
Monocytes Absolute: 1.8 10*3/uL — ABNORMAL HIGH (ref 0.1–1.0)
Monocytes Absolute: 0.5 10*3/uL (ref 0.1–1.0)
Monocytes Relative: 7 %
Monocytes Relative: 4 %
Neutro Abs: 22.4 10*3/uL — ABNORMAL HIGH (ref 1.7–7.7)
Neutro Abs: 11 10*3/uL — ABNORMAL HIGH (ref 1.7–7.7)
Neutrophils Relative %: 89 %
Neutrophils Relative %: 92 %
Platelets: 236 10*3/uL (ref 150–400)
Platelets: 161 10*3/uL (ref 150–400)
RBC: 5.49 MIL/uL (ref 4.22–5.81)
RBC: 4.27 MIL/uL (ref 4.22–5.81)
RDW: 16.4 % — ABNORMAL HIGH (ref 11.5–15.5)
RDW: 16.1 % — ABNORMAL HIGH (ref 11.5–15.5)
Smear Review: NORMAL
WBC Morphology: INCREASED
WBC: 25.2 10*3/uL — ABNORMAL HIGH (ref 4.0–10.5)
WBC: 11.9 10*3/uL — ABNORMAL HIGH (ref 4.0–10.5)
nRBC: 0 % (ref 0.0–0.2)
nRBC: 0 % (ref 0.0–0.2)

## 2022-10-13 LAB — BLOOD GAS, ARTERIAL
Acid-base deficit: 5.5 mmol/L — ABNORMAL HIGH (ref 0.0–2.0)
Bicarbonate: 18 mmol/L — ABNORMAL LOW (ref 20.0–28.0)
FIO2: 40 %
MECHVT: 550 mL
Mechanical Rate: 18
O2 Saturation: 96.8 %
PEEP: 5 cmH2O
Patient temperature: 37
pCO2 arterial: 29 mmHg — ABNORMAL LOW (ref 32–48)
pH, Arterial: 7.4 (ref 7.35–7.45)
pO2, Arterial: 166 mmHg — ABNORMAL HIGH (ref 83–108)

## 2022-10-13 LAB — RESP PANEL BY RT-PCR (RSV, FLU A&B, COVID)  RVPGX2
Influenza A by PCR: NEGATIVE
Influenza B by PCR: NEGATIVE
Resp Syncytial Virus by PCR: NEGATIVE
SARS Coronavirus 2 by RT PCR: NEGATIVE

## 2022-10-13 LAB — URINALYSIS, COMPLETE (UACMP) WITH MICROSCOPIC
Bilirubin Urine: NEGATIVE
Glucose, UA: >=500 mg/dL — AB
Ketones, ur: NEGATIVE mg/dL
Leukocytes,Ua: NEGATIVE
Nitrite: NEGATIVE
Protein, ur: 30 mg/dL — AB
Specific Gravity, Urine: 1.014 (ref 1.005–1.030)
pH: 5 (ref 5.0–8.0)

## 2022-10-13 LAB — COMPREHENSIVE METABOLIC PANEL
ALT: 32 U/L (ref 0–44)
AST: 57 U/L — ABNORMAL HIGH (ref 15–41)
Albumin: 3.2 g/dL — ABNORMAL LOW (ref 3.5–5.0)
Alkaline Phosphatase: 81 U/L (ref 38–126)
Anion gap: 20 — ABNORMAL HIGH (ref 5–15)
BUN: 62 mg/dL — ABNORMAL HIGH (ref 8–23)
CO2: 18 mmol/L — ABNORMAL LOW (ref 22–32)
Calcium: 8.5 mg/dL — ABNORMAL LOW (ref 8.9–10.3)
Chloride: 102 mmol/L (ref 98–111)
Creatinine, Ser: 2.72 mg/dL — ABNORMAL HIGH (ref 0.61–1.24)
GFR, Estimated: 24 mL/min — ABNORMAL LOW (ref >60)
Glucose, Bld: 425 mg/dL — ABNORMAL HIGH (ref 70–99)
Potassium: 4.4 mmol/L (ref 3.5–5.1)
Sodium: 140 mmol/L (ref 135–145)
Total Bilirubin: 1 mg/dL (ref 0.3–1.2)
Total Protein: 6.6 g/dL (ref 6.5–8.1)

## 2022-10-13 LAB — BLOOD GAS, VENOUS
Acid-base deficit: 8.5 mmol/L — ABNORMAL HIGH (ref 0.0–2.0)
Bicarbonate: 19.2 mmol/L — ABNORMAL LOW (ref 20.0–28.0)
O2 Saturation: 31.6 %
Patient temperature: 37
pCO2, Ven: 47 mmHg (ref 44–60)
pH, Ven: 7.22 — ABNORMAL LOW (ref 7.25–7.43)
pO2, Ven: 33 mmHg (ref 32–45)

## 2022-10-13 LAB — CBG MONITORING, ED
Glucose-Capillary: 306 mg/dL — ABNORMAL HIGH (ref 70–99)
Glucose-Capillary: 295 mg/dL — ABNORMAL HIGH (ref 70–99)

## 2022-10-13 LAB — GLUCOSE, CAPILLARY
Glucose-Capillary: 195 mg/dL — ABNORMAL HIGH (ref 70–99)
Glucose-Capillary: 214 mg/dL — ABNORMAL HIGH (ref 70–99)
Glucose-Capillary: 261 mg/dL — ABNORMAL HIGH (ref 70–99)
Glucose-Capillary: 284 mg/dL — ABNORMAL HIGH (ref 70–99)
Glucose-Capillary: 322 mg/dL — ABNORMAL HIGH (ref 70–99)

## 2022-10-13 LAB — URINE DRUG SCREEN, QUALITATIVE (ARMC ONLY)
Amphetamines, Ur Screen: NOT DETECTED
Barbiturates, Ur Screen: NOT DETECTED
Benzodiazepine, Ur Scrn: POSITIVE — AB
Cannabinoid 50 Ng, Ur ~~LOC~~: NOT DETECTED
Cocaine Metabolite,Ur ~~LOC~~: NOT DETECTED
MDMA (Ecstasy)Ur Screen: NOT DETECTED
Methadone Scn, Ur: NOT DETECTED
Opiate, Ur Screen: NOT DETECTED
Phencyclidine (PCP) Ur S: NOT DETECTED
Tricyclic, Ur Screen: NOT DETECTED

## 2022-10-13 LAB — BASIC METABOLIC PANEL
Anion gap: 12 (ref 5–15)
BUN: 59 mg/dL — ABNORMAL HIGH (ref 8–23)
CO2: 16 mmol/L — ABNORMAL LOW (ref 22–32)
Calcium: 7.3 mg/dL — ABNORMAL LOW (ref 8.9–10.3)
Chloride: 110 mmol/L (ref 98–111)
Creatinine, Ser: 2.08 mg/dL — ABNORMAL HIGH (ref 0.61–1.24)
GFR, Estimated: 33 mL/min — ABNORMAL LOW (ref >60)
Glucose, Bld: 326 mg/dL — ABNORMAL HIGH (ref 70–99)
Potassium: 3.7 mmol/L (ref 3.5–5.1)
Sodium: 138 mmol/L (ref 135–145)

## 2022-10-13 LAB — APTT: aPTT: 39 seconds — ABNORMAL HIGH (ref 24–36)

## 2022-10-13 LAB — TROPONIN I (HIGH SENSITIVITY)
Troponin I (High Sensitivity): 859 ng/L (ref <18)
Troponin I (High Sensitivity): 1467 ng/L (ref <18)

## 2022-10-13 LAB — BETA-HYDROXYBUTYRIC ACID: Beta-Hydroxybutyric Acid: 0.92 mmol/L — ABNORMAL HIGH (ref 0.05–0.27)

## 2022-10-13 LAB — PROTIME-INR
INR: 1.2 (ref 0.8–1.2)
Prothrombin Time: 15.5 seconds — ABNORMAL HIGH (ref 11.4–15.2)

## 2022-10-13 LAB — LACTIC ACID, PLASMA
Lactic Acid, Venous: 5.8 mmol/L (ref 0.5–1.9)
Lactic Acid, Venous: 2.7 mmol/L (ref 0.5–1.9)

## 2022-10-13 LAB — MRSA NEXT GEN BY PCR, NASAL: MRSA by PCR Next Gen: NOT DETECTED

## 2022-10-13 LAB — PHOSPHORUS: Phosphorus: 6.1 mg/dL — ABNORMAL HIGH (ref 2.5–4.6)

## 2022-10-13 LAB — PROCALCITONIN: Procalcitonin: 38.24 ng/mL

## 2022-10-13 LAB — MAGNESIUM: Magnesium: 1.8 mg/dL (ref 1.7–2.4)

## 2022-10-13 LAB — CK: Total CK: 776 U/L — ABNORMAL HIGH (ref 49–397)

## 2022-10-13 SURGERY — LAPAROTOMY, EXPLORATORY
Anesthesia: General | Laterality: Right

## 2022-10-13 MED ORDER — POLYETHYLENE GLYCOL 3350 17 G PO PACK: 17.0000 g | PACK | Freq: Every day | ORAL | Status: DC

## 2022-10-13 MED ORDER — PANTOPRAZOLE SODIUM 40 MG IV SOLR
40.0000 mg | Freq: Every day | INTRAVENOUS | Status: DC
  Administered 2022-10-13: 40 mg via INTRAVENOUS
  Filled 2022-10-13: qty 10

## 2022-10-13 MED ORDER — DEXAMETHASONE SODIUM PHOSPHATE 10 MG/ML IJ SOLN
INTRAMUSCULAR | Status: DC | PRN
  Administered 2022-10-13: 4 mg via INTRAVENOUS

## 2022-10-13 MED ORDER — SODIUM CHLORIDE 0.9% FLUSH: 3.0000 mL | INTRAVENOUS | Status: DC | PRN

## 2022-10-13 MED ORDER — EPHEDRINE 5 MG/ML INJ
INTRAVENOUS | Status: AC
  Filled 2022-10-13: qty 5

## 2022-10-13 MED ORDER — SODIUM CHLORIDE 0.9 % IV SOLN: 250.0000 mL | INTRAVENOUS | Status: DC | PRN

## 2022-10-13 MED ORDER — FENTANYL CITRATE (PF) 100 MCG/2ML IJ SOLN
INTRAMUSCULAR | Status: DC | PRN
  Administered 2022-10-13: 50 ug via INTRAVENOUS
  Administered 2022-10-13 (×2): 25 ug via INTRAVENOUS

## 2022-10-13 MED ORDER — DEXAMETHASONE SODIUM PHOSPHATE 10 MG/ML IJ SOLN
INTRAMUSCULAR | Status: AC
  Filled 2022-10-13: qty 1

## 2022-10-13 MED ORDER — NOREPINEPHRINE 4 MG/250ML-% IV SOLN
INTRAVENOUS | Status: AC
  Filled 2022-10-13: qty 250

## 2022-10-13 MED ORDER — LACTATED RINGERS IV BOLUS: 20.0000 mL/kg | Freq: Once | INTRAVENOUS | Status: DC

## 2022-10-13 MED ORDER — GLYCOPYRROLATE 0.2 MG/ML IJ SOLN
INTRAMUSCULAR | Status: AC
  Filled 2022-10-13: qty 1

## 2022-10-13 MED ORDER — FENTANYL CITRATE PF 50 MCG/ML IJ SOSY
25.0000 ug | PREFILLED_SYRINGE | Freq: Once | INTRAMUSCULAR | Status: AC
  Administered 2022-10-13: 25 ug via INTRAVENOUS
  Filled 2022-10-13: qty 1

## 2022-10-13 MED ORDER — ONDANSETRON HCL 4 MG/2ML IJ SOLN
INTRAMUSCULAR | Status: AC
  Filled 2022-10-13: qty 2

## 2022-10-13 MED ORDER — SODIUM CHLORIDE 0.9 % IV SOLN
2.0000 g | Freq: Once | INTRAVENOUS | Status: AC
  Administered 2022-10-13: 2 g via INTRAVENOUS
  Filled 2022-10-13: qty 12.5

## 2022-10-13 MED ORDER — FENTANYL CITRATE (PF) 100 MCG/2ML IJ SOLN
INTRAMUSCULAR | Status: AC
  Filled 2022-10-13: qty 2

## 2022-10-13 MED ORDER — VANCOMYCIN HCL 1250 MG/250ML IV SOLN
1250.0000 mg | Freq: Once | INTRAVENOUS | Status: AC
  Administered 2022-10-13: 1250 mg via INTRAVENOUS
  Filled 2022-10-13: qty 250

## 2022-10-13 MED ORDER — LIDOCAINE HCL (PF) 2 % IJ SOLN
INTRAMUSCULAR | Status: AC
  Filled 2022-10-13: qty 5

## 2022-10-13 MED ORDER — EPINEPHRINE PF 1 MG/ML IJ SOLN
INTRAMUSCULAR | Status: AC
  Filled 2022-10-13: qty 1

## 2022-10-13 MED ORDER — PHENYLEPHRINE HCL (PRESSORS) 10 MG/ML IV SOLN
INTRAVENOUS | Status: DC | PRN
  Administered 2022-10-13 (×2): 160 ug via INTRAVENOUS
  Administered 2022-10-13: 80 ug via INTRAVENOUS

## 2022-10-13 MED ORDER — LIDOCAINE HCL (CARDIAC) PF 100 MG/5ML IV SOSY
PREFILLED_SYRINGE | INTRAVENOUS | Status: DC | PRN
  Administered 2022-10-13: 50 mg via INTRAVENOUS

## 2022-10-13 MED ORDER — LACTATED RINGERS IV SOLN: INTRAVENOUS | Status: DC

## 2022-10-13 MED ORDER — FENTANYL BOLUS VIA INFUSION: 25.0000 ug | INTRAVENOUS | Status: DC | PRN

## 2022-10-13 MED ORDER — NOREPINEPHRINE 4 MG/250ML-% IV SOLN
INTRAVENOUS | Status: DC | PRN
  Administered 2022-10-13: 1 ug/min via INTRAVENOUS

## 2022-10-13 MED ORDER — SODIUM CHLORIDE 0.9 % IV SOLN: Freq: Once | INTRAVENOUS | Status: AC

## 2022-10-13 MED ORDER — ORAL CARE MOUTH RINSE
15.0000 mL | OROMUCOSAL | Status: DC
  Administered 2022-10-13 – 2022-10-14 (×3): 15 mL via OROMUCOSAL

## 2022-10-13 MED ORDER — SUCCINYLCHOLINE CHLORIDE 200 MG/10ML IV SOSY
PREFILLED_SYRINGE | INTRAVENOUS | Status: AC
  Filled 2022-10-13: qty 10

## 2022-10-13 MED ORDER — ALBUMIN HUMAN 5 % IV SOLN: INTRAVENOUS | Status: DC | PRN

## 2022-10-13 MED ORDER — ALBUMIN HUMAN 5 % IV SOLN
INTRAVENOUS | Status: AC
  Filled 2022-10-13: qty 500

## 2022-10-13 MED ORDER — PIPERACILLIN-TAZOBACTAM 3.375 G IVPB: 3.3750 g | Freq: Once | INTRAVENOUS | Status: DC

## 2022-10-13 MED ORDER — NOREPINEPHRINE 4 MG/250ML-% IV SOLN
0.0000 ug/min | INTRAVENOUS | Status: DC
  Administered 2022-10-14: 2 ug/min via INTRAVENOUS

## 2022-10-13 MED ORDER — SODIUM CHLORIDE (PF) 0.9 % IJ SOLN
INTRAMUSCULAR | Status: AC
  Filled 2022-10-13: qty 50

## 2022-10-13 MED ORDER — PROPOFOL 1000 MG/100ML IV EMUL
0.0000 ug/kg/min | INTRAVENOUS | Status: DC
  Administered 2022-10-13: 5 ug/kg/min via INTRAVENOUS
  Filled 2022-10-13: qty 100

## 2022-10-13 MED ORDER — PHENYLEPHRINE 80 MCG/ML (10ML) SYRINGE FOR IV PUSH (FOR BLOOD PRESSURE SUPPORT)
PREFILLED_SYRINGE | INTRAVENOUS | Status: AC
  Filled 2022-10-13: qty 10

## 2022-10-13 MED ORDER — DEXTROSE IN LACTATED RINGERS 5 % IV SOLN: INTRAVENOUS | Status: DC

## 2022-10-13 MED ORDER — 0.9 % SODIUM CHLORIDE (POUR BTL) OPTIME
TOPICAL | Status: DC | PRN
  Administered 2022-10-13: 3000 mL

## 2022-10-13 MED ORDER — ROCURONIUM BROMIDE 100 MG/10ML IV SOLN
INTRAVENOUS | Status: DC | PRN
  Administered 2022-10-13: 40 mg via INTRAVENOUS
  Administered 2022-10-13: 50 mg via INTRAVENOUS
  Administered 2022-10-13: 10 mg via INTRAVENOUS

## 2022-10-13 MED ORDER — INSULIN REGULAR(HUMAN) IN NACL 100-0.9 UT/100ML-% IV SOLN
INTRAVENOUS | Status: DC
  Administered 2022-10-13: 9 [IU]/h via INTRAVENOUS
  Filled 2022-10-13: qty 100

## 2022-10-13 MED ORDER — DEXTROSE 50 % IV SOLN: 0.0000 mL | INTRAVENOUS | Status: DC | PRN

## 2022-10-13 MED ORDER — SODIUM BICARBONATE 8.4 % IV SOLN
100.0000 meq | Freq: Once | INTRAVENOUS | Status: AC
  Administered 2022-10-13: 100 meq via INTRAVENOUS
  Filled 2022-10-13: qty 100

## 2022-10-13 MED ORDER — MIDAZOLAM HCL 2 MG/2ML IJ SOLN
INTRAMUSCULAR | Status: AC
  Filled 2022-10-13: qty 2

## 2022-10-13 MED ORDER — ONDANSETRON HCL 4 MG/2ML IJ SOLN
INTRAMUSCULAR | Status: DC | PRN
  Administered 2022-10-13: 4 mg via INTRAVENOUS

## 2022-10-13 MED ORDER — ONDANSETRON HCL 4 MG/2ML IJ SOLN: 4.0000 mg | Freq: Four times a day (QID) | INTRAMUSCULAR | Status: DC | PRN

## 2022-10-13 MED ORDER — ROCURONIUM BROMIDE 10 MG/ML (PF) SYRINGE
PREFILLED_SYRINGE | INTRAVENOUS | Status: AC
  Filled 2022-10-13: qty 10

## 2022-10-13 MED ORDER — FENTANYL 2500MCG IN NS 250ML (10MCG/ML) PREMIX INFUSION
25.0000 ug/h | INTRAVENOUS | Status: DC
  Administered 2022-10-13: 25 ug/h via INTRAVENOUS
  Filled 2022-10-13: qty 250

## 2022-10-13 MED ORDER — HEPARIN SODIUM (PORCINE) 5000 UNIT/ML IJ SOLN
5000.0000 [IU] | Freq: Three times a day (TID) | INTRAMUSCULAR | Status: DC
  Filled 2022-10-13: qty 1

## 2022-10-13 MED ORDER — BUPIVACAINE HCL (PF) 0.25 % IJ SOLN
INTRAMUSCULAR | Status: AC
  Filled 2022-10-13: qty 30

## 2022-10-13 MED ORDER — DOCUSATE SODIUM 50 MG/5ML PO LIQD: 100.0000 mg | Freq: Two times a day (BID) | ORAL | Status: DC

## 2022-10-13 MED ORDER — LACTATED RINGERS IV BOLUS (SEPSIS)
1000.0000 mL | Freq: Once | INTRAVENOUS | Status: AC
  Administered 2022-10-13: 1000 mL via INTRAVENOUS

## 2022-10-13 MED ORDER — SODIUM CHLORIDE 0.9 % IV BOLUS: 1000.0000 mL | Freq: Once | INTRAVENOUS | Status: DC

## 2022-10-13 MED ORDER — PROPOFOL 10 MG/ML IV BOLUS
INTRAVENOUS | Status: DC | PRN
  Administered 2022-10-13: 100 mg via INTRAVENOUS

## 2022-10-13 MED ORDER — STERILE WATER FOR INJECTION IV SOLN
INTRAVENOUS | Status: DC
  Filled 2022-10-13: qty 1000
  Filled 2022-10-13: qty 150

## 2022-10-13 MED ORDER — SODIUM CHLORIDE 0.9 % IV SOLN: INTRAVENOUS | Status: DC | PRN

## 2022-10-13 MED ORDER — MIDAZOLAM HCL 2 MG/2ML IJ SOLN
INTRAMUSCULAR | Status: DC | PRN
  Administered 2022-10-13: 4 mg via INTRAVENOUS

## 2022-10-13 MED ORDER — ORAL CARE MOUTH RINSE: 15.0000 mL | OROMUCOSAL | Status: DC | PRN

## 2022-10-13 MED ORDER — BUPIVACAINE LIPOSOME 1.3 % IJ SUSP
INTRAMUSCULAR | Status: AC
  Filled 2022-10-13: qty 20

## 2022-10-13 MED ORDER — SODIUM BICARBONATE 8.4 % IV SOLN
INTRAVENOUS | Status: DC
  Filled 2022-10-13 (×2): qty 1000
  Filled 2022-10-13: qty 150

## 2022-10-13 MED ORDER — POTASSIUM CHLORIDE 10 MEQ/100ML IV SOLN: 10.0000 meq | INTRAVENOUS | Status: AC

## 2022-10-13 MED ORDER — SUCCINYLCHOLINE CHLORIDE 200 MG/10ML IV SOSY
PREFILLED_SYRINGE | INTRAVENOUS | Status: DC | PRN
  Administered 2022-10-13: 100 mg via INTRAVENOUS

## 2022-10-13 MED ORDER — SODIUM CHLORIDE 0.9% FLUSH
3.0000 mL | Freq: Two times a day (BID) | INTRAVENOUS | Status: DC
  Administered 2022-10-13: 3 mL via INTRAVENOUS

## 2022-10-13 MED ORDER — PROPOFOL 10 MG/ML IV BOLUS
INTRAVENOUS | Status: AC
  Filled 2022-10-13: qty 20

## 2022-10-13 MED ORDER — VANCOMYCIN HCL IN DEXTROSE 1-5 GM/200ML-% IV SOLN
1000.0000 mg | Freq: Once | INTRAVENOUS | Status: DC
  Filled 2022-10-13: qty 200

## 2022-10-13 MED ORDER — METRONIDAZOLE 500 MG/100ML IV SOLN
500.0000 mg | Freq: Once | INTRAVENOUS | Status: AC
  Administered 2022-10-13: 500 mg via INTRAVENOUS
  Filled 2022-10-13: qty 100

## 2022-10-13 SURGICAL SUPPLY — 46 items
APPLIER CLIP 11 MED OPEN (CLIP)
APPLIER CLIP 13 LRG OPEN (CLIP)
BARRIER ADH SEPRAFILM 3INX5IN (MISCELLANEOUS) IMPLANT
BLADE CLIPPER SURG (BLADE) ×3 IMPLANT
CANISTER WOUND CARE 500ML ATS (WOUND CARE) IMPLANT
CHLORAPREP W/TINT 26 (MISCELLANEOUS) ×3 IMPLANT
CLIP APPLIE 11 MED OPEN (CLIP) IMPLANT
CLIP APPLIE 13 LRG OPEN (CLIP) IMPLANT
COVER BACK TABLE REUSABLE LG (DRAPES) IMPLANT
DRAPE LAPAROTOMY 100X77 ABD (DRAPES) ×3 IMPLANT
DRSG OPSITE POSTOP 4X10 (GAUZE/BANDAGES/DRESSINGS) IMPLANT
DRSG OPSITE POSTOP 4X12 (GAUZE/BANDAGES/DRESSINGS) IMPLANT
DRSG TEGADERM 4X4.75 (GAUZE/BANDAGES/DRESSINGS) IMPLANT
ELECT BLADE 6.5 EXT (BLADE) ×3 IMPLANT
ELECT REM PT RETURN 9FT ADLT (ELECTROSURGICAL) ×3
ELECTRODE REM PT RTRN 9FT ADLT (ELECTROSURGICAL) ×3 IMPLANT
GLOVE BIO SURGEON STRL SZ7 (GLOVE) ×6 IMPLANT
GOWN STRL REUS W/ TWL LRG LVL3 (GOWN DISPOSABLE) ×6 IMPLANT
GOWN STRL REUS W/TWL LRG LVL3 (GOWN DISPOSABLE) ×6
LIGASURE IMPACT 36 18CM CVD LR (INSTRUMENTS) IMPLANT
MANIFOLD NEPTUNE II (INSTRUMENTS) ×3 IMPLANT
NEEDLE HYPO 22GX1.5 SAFETY (NEEDLE) ×6 IMPLANT
PACK BASIN MAJOR ARMC (MISCELLANEOUS) ×3 IMPLANT
RELOAD PROXIMATE 75MM BLUE (ENDOMECHANICALS) ×3 IMPLANT
RELOAD STAPLE 75 3.8 BLU REG (ENDOMECHANICALS) IMPLANT
SPONGE ABDOMINAL VAC ABTHERA (MISCELLANEOUS) IMPLANT
SPONGE T-LAP 18X18 ~~LOC~~+RFID (SPONGE) ×3 IMPLANT
SPONGE T-LAP 18X36 ~~LOC~~+RFID STR (SPONGE) IMPLANT
STAPLER PROXIMATE 75MM BLUE (STAPLE) IMPLANT
STAPLER SKIN PROX 35W (STAPLE) ×3 IMPLANT
SUT ETHIBOND 0 MO6 C/R (SUTURE) IMPLANT
SUT PDS AB 0 CT1 27 (SUTURE) ×9 IMPLANT
SUT SILK 2 0 (SUTURE) ×3
SUT SILK 2 0 SH CR/8 (SUTURE) ×3 IMPLANT
SUT SILK 2 0SH CR/8 30 (SUTURE) ×3 IMPLANT
SUT SILK 2-0 18XBRD TIE 12 (SUTURE) ×3 IMPLANT
SUT VIC AB 0 CT1 36 (SUTURE) ×6 IMPLANT
SUT VIC AB 2-0 SH 27 (SUTURE) ×3
SUT VIC AB 2-0 SH 27XBRD (SUTURE) IMPLANT
SUT VIC AB 3-0 SH 27 (SUTURE) ×3
SUT VIC AB 3-0 SH 27X BRD (SUTURE) ×3 IMPLANT
SYR 30ML LL (SYRINGE) ×6 IMPLANT
SYR 3ML LL SCALE MARK (SYRINGE) ×3 IMPLANT
TRAP FLUID SMOKE EVACUATOR (MISCELLANEOUS) ×3 IMPLANT
TRAY FOLEY MTR SLVR 16FR STAT (SET/KITS/TRAYS/PACK) ×3 IMPLANT
WATER STERILE IRR 500ML POUR (IV SOLUTION) ×3 IMPLANT

## 2022-10-13 NOTE — H&P (Signed)
NAME:  Colin Levine, MRN:  [id number], DOB:  1951-08-25, LOS: 0 ADMISSION DATE:  2022/11/02, CONSULTATION DATE:  10/13/21 REFERRING MD:  Dr. Kerman Passey, CHIEF COMPLAINT:  AMS  History of Present Illness:  72 y.o. male with a past medical history of CAD, diabetes, hypertension, presents emergency department for altered mental status.    According to EMS patient is coming from home for decreased responsiveness/altered mental status.  According to EMS the daughter states the patient was at the [place] yesterday due to nausea and vomiting.  Also is diabetic.  EMS reports a CBG of 365 has received 500 cc of fluid prior to arrival for borderline hypotension around 90 systolic.   ED course: Upon arrival patient had 710 systolic with a 62.6 rectal temperature.  Patient will answer when you call his name but he answers "yes" to every question that is asked.  Patient appears somnolent, weak and confused. Lab work suggestive of sepsis, DKA with elevated troponin, AKI, AGMA, lactic acidosis, leukocytosis & hyperglycemia. Imaging significant for large hernia and SBO. General surgery evaluated and decision to take the patient emergently for exploratory laparotomy.   Medications given: 3 L IVF bolus, albumin, anesthesia and levophed Initial Vitals: 97.4, 24, 95, 111/82 & 100% RA Significant labs: (Labs/ Imaging personally reviewed) I, Domingo Pulse Rust-Chester, AGACNP-BC, personally viewed and interpreted this ECG. EKG Interpretation: Date: 02-Nov-2022, EKG Time: 14:58, Rate: 87, Rhythm: NSR, QRS Axis: RAD, Intervals: prolonged Qtc, ST/T Wave abnormalities: significant lateral T wave inversions & lead II, Narrative Interpretation: NSR and signs of ischemia Chemistry: Na+: 140, K+: 4.4, BUN/Cr.: 62/2.72, Serum CO2/ AG: 18/20,  AST/ALT: 57/32, glucose: 425 Hematology: WBC: 25.2, Hgb: 11.9,  Troponin: 859, Lactic/ PCT: 5.8/ 38.24, COVID-19 & Influenza A/B: negative  ABG: 7.22/ 47/ 33/ 19.2 CXR 11-02-22: Bibasilar  atelectasis CT head wo contrast 11/02/22: no acute intracranial findings are seen in non contrast CT brain. Atrophy. Small vessel disease. Chronic sinusitis CT abdomen/pelvis wo contrast 11/02/22: Dilated loops of bowel in the left abdomen. Findings are concerning for at least a partial small bowel obstruction. Large right inguinal hernia containing loops of bowel but the inguinal hernia is not completely imaged. Obstruction could be associated with this large inguinal hernia. Mesenteric edema and small amount of abdominal ascites. These findings are new since 04/24/2022 and could be related to a bowel obstruction. Dilated gallbladder with cholelithiasis. Cholecystitis cannot be excluded. Patchy densities at the left lung base likely associated with atelectasis. Small focus of infection or aspiration is also in the differential diagnosis. Severe distension of the urinary bladder without hydronephrosis.  Patient intubated for exploratory laparotomy and small bowel resection, abdomen left open with wound vac in place. Patient remaining intubated requiring post op mechanical ventilatory support. PCCM consulted for admission.  Pertinent  Medical History  CAD T2DM HTN CABG  Significant Hospital Events: Including procedures, antibiotic start and stop dates in addition to other pertinent events   1/4 admitted to ICU for severe sepsis SBO, large hernia s/p ex-lap with resection on post-op mechanical ventilatory support and DKA.  Interim History / Subjective:  Patient intubated and sedated on mechanical ventilation and wound vac in place.  Objective   Blood pressure (!) 113/53, pulse 100, temperature (!) 97.5 F (36.4 C), resp. rate 16, height 5\' 9"  (1.753 m), weight 70.3 kg, SpO2 99 %.    Vent Mode: PRVC FiO2 (%):  [40 %] 40 % Set Rate:  [18 bmp] 18 bmp Vt Set:  [550 mL] 550 mL PEEP:  [  5 cmH20] 5 cmH20 Plateau Pressure:  [18 cmH20] 18 cmH20   Intake/Output Summary (Last 24 hours) at 10/20/2022  1951 Last data filed at Oct 20, 2022 1927 Gross per 24 hour  Intake 2200 ml  Output 1300 ml  Net 900 ml   Filed Weights   2022-10-20 1510  Weight: 70.3 kg    Examination: General: Adult male, critically ill, lying in bed intubated & sedated requiring mechanical ventilation, NAD HEENT: MM pink/moist, anicteric, atraumatic, neck supple Neuro: sedated, unable to follow commands, PERRL +3 CV: s1s2 RRR, NSR on monitor, no r/m/g Pulm: Regular, non labored on PRVC 40% & PEEP 5, breath sounds clear-BUL & diminished-BLL GI: soft, rounded wound vac in place, bs x 4 GU: foley in place with clear yellow urine Skin: lesions noted to left foot, significant DTI to sacrum & buttocks  Extremities: warm/dry, pulses + 2 R/P, no edema noted  Resolved Hospital Problem list     Assessment & Plan:  72 yo male with acute BOWEL OBSTRUCTION WITH HYPOVOLUMIC SHOCK AND SEVERE SEPSIS WITH DKA AND RENAL FAILURE WITH METABOLIC ENCEPHALOPATHY COMPLICATED BY ACUTE DEMAND ISCHEMIA  Post-Op Mechanical Ventilation - Ventilator settings: PRVC  8 mL/kg, 40% FiO2, 5 PEEP, continue ventilator support & lung protective strategies - Wean PEEP & FiO2 as tolerated, maintain SpO2 > 90% - Head of bed elevated 30 degrees, VAP protocol in place - Plateau pressures less than 30 cm H20  - Intermittent chest x-ray & ABG PRN - Daily WUA with SBT as tolerated  - Ensure adequate pulmonary hygiene  - F/u cultures, trend PCT - Bronchodilators PRN - PAD protocol in place: continue Fentanyl drip & Propofol drip  Small Bowel Obstruction in the setting of incarcerated Inguinal hernia s/p ex-lap and resection - general surgery following, appreciate input - continue zosyn - wound vac monitoring - daily CBC, monitor WBC/fever curve - trend lactic  Diabetic ketoacidosis  PMHx: T2DM - DKA protocol initiated - initiate sodium bicarbonate infusion - Aggressive IV hydration, when blood glucose falls below 250 add D5 to IV fluids -  Insulin drip ordered per Endo tool protocol - Strict I/O's: alert provider if UOP < 0.5 mL/kg/hr - Q 4 BMP, closely monitor potassium, replace electrolytes PRN - monitor blood glucose every 1 hour, per Endo tool protocol - trend serum CO2/ AG/ Lactic, f/u A1C - Diabetes coordinator consult  Elevated Troponin secondary to N-STEMI vs demand ischemia PMHx: CABG, CAD - Trend troponin - follow up Echocardiogram  - unable to start heparin drip due to abdominal surgery - continuous cardiac monitoring - Consult cardiology if needed  Pressure Injury to Sacrum & Buttocks - WOC consult - offloading measures  Best Practice (right click and "Reselect all SmartList Selections" daily)  Diet/type: NPO DVT prophylaxis: SCD GI prophylaxis: PPI Lines: Central line, Arterial Line, and yes and it is still needed Foley:  Yes, and it is still needed Code Status:  full code Last date of multidisciplinary goals of care discussion [10/13/21]  Labs   CBC: Recent Labs  Lab Oct 20, 2022 1517  WBC 25.2*  NEUTROABS 22.4*  HGB 11.9*  HCT 41.0  MCV 74.7*  PLT 236    Basic Metabolic Panel: Recent Labs  Lab Oct 20, 2022 1517  NA 140  K 4.4  CL 102  CO2 18*  GLUCOSE 425*  BUN 62*  CREATININE 2.72*  CALCIUM 8.5*   GFR: Estimated Creatinine Clearance: 24.1 mL/min (A) (by C-G formula based on SCr of 2.72 mg/dL (H)). Recent Labs  Lab 10-20-2022 1517  07-Nov-2022 1521  WBC 25.2*  --   LATICACIDVEN  --  5.8*    Liver Function Tests: Recent Labs  Lab 11/07/22 1517  AST 57*  ALT 32  ALKPHOS 81  BILITOT 1.0  PROT 6.6  ALBUMIN 3.2*   No results for input(s): "LIPASE", "AMYLASE" in the last 168 hours. No results for input(s): "AMMONIA" in the last 168 hours.  ABG    Component Value Date/Time   HCO3 19.2 (L) 11/07/22 1521   ACIDBASEDEF 8.5 (H) November 07, 2022 1521   O2SAT 31.6 Nov 07, 2022 1521     Coagulation Profile: Recent Labs  Lab 11-07-2022 1517  INR 1.2    Cardiac Enzymes: Recent Labs   Lab November 07, 2022 1517  CKTOTAL 776*    HbA1C: No results found for: "HGBA1C"  CBG: Recent Labs  Lab November 07, 2022 1457 11/07/22 1845  GLUCAP 306* 295*    Review of Systems:   UTA patient intubated and sedated, unable to participate in interview at this time  Past Medical History:  He,  has a past medical history of Coronary artery disease, Diabetes mellitus without complication (HCC), and Hypertension.   Surgical History:   Past Surgical History:  Procedure Laterality Date   ABDOMINAL SURGERY     bipass surgery     CARDIAC SURGERY     EYE SURGERY     FRACTURE SURGERY     NECK SURGERY       Social History:   reports that he has never smoked. He has never used smokeless tobacco. He reports that he does not drink alcohol and does not use drugs.   Family History:  His family history includes Cancer in his brother; Diabetes in his mother; Lupus in his mother.   Allergies Allergies  Allergen Reactions   Liraglutide Diarrhea and Nausea Only    Other reaction(s): Abdominal pain, Loss of appetite  Other Reaction(s): Nausea, Loss of appetite, Diarrhea, Abdominal pain, Nausea, Loss of appetite, Diarrhea, Abdominal pain     Home Medications  Prior to Admission medications   Medication Sig Start Date End Date Taking? Authorizing Provider  acetaminophen (TYLENOL) 325 MG tablet Take 650 mg by mouth every 6 (six) hours as needed.   Yes [provider]  aspirin EC 81 MG tablet Take 81 mg by mouth daily.   Yes [provider]  atorvastatin (LIPITOR) 80 MG tablet Take 40 mg by mouth at bedtime. 11/27/21  Yes [provider]  buPROPion (WELLBUTRIN) 100 MG tablet Take 100 mg by mouth 2 (two) times daily.   Yes [provider]  cetirizine (ZYRTEC) 10 MG tablet Take 10 mg by mouth daily.   Yes [provider]  chlorthalidone (HYGROTON) 50 MG tablet Take 50 mg by mouth daily. 11/27/21  Yes [provider]  cholecalciferol (VITAMIN D3) 25  MCG (1000 UNIT) tablet Take 1,000 Units by mouth daily.   Yes [provider]  empagliflozin (JARDIANCE) 25 MG TABS tablet Take 25 mg by mouth daily. 05/03/21  Yes [provider]  Insulin Aspart FlexPen (NOVOLOG) 100 UNIT/ML Inject 6 Units into the skin 3 (three) times daily before meals. 11/27/21  Yes [provider]  isosorbide mononitrate (IMDUR) 120 MG 24 hr tablet Take 120 mg by mouth daily. 11/27/21  Yes [provider]  levothyroxine (SYNTHROID) 50 MCG tablet Take 50 mcg by mouth daily before breakfast.   Yes [provider]  lisinopril (ZESTRIL) 40 MG tablet Take 40 mg by mouth daily.   Yes [provider]  metFORMIN (GLUCOPHAGE) 500 MG tablet Take 500 mg by mouth 2 (two) times daily with a meal.   Yes [provider]  mirtazapine (REMERON) 7.5 MG tablet Take 7.5 mg by mouth at bedtime.   Yes [provider]  polyethylene glycol (MIRALAX / GLYCOLAX) 17 g packet Take 17 g by mouth every 3 (three) days.   Yes [provider]  prazosin (MINIPRESS) 2 MG capsule Take 4 mg by mouth at bedtime. 08/16/21  Yes [provider]  sennosides-docusate sodium (SENOKOT-S) 8.6-50 MG tablet Take 1 tablet by mouth daily.   Yes [provider]  sertraline (ZOLOFT) 100 MG tablet Take 200 mg by mouth at bedtime. 04/15/22  Yes [provider]  SOTALOL AF 80 MG TABS Take 80 mg by mouth every 12 (twelve) hours. 09/08/22  Yes [provider]  traMADol (ULTRAM) 50 MG tablet Take 50 mg by mouth 2 (two) times daily. 09/13/21  Yes [provider]  atenolol-chlorthalidone (TENORETIC) 50-25 MG tablet Take 1 tablet by mouth daily. Patient not taking: Reported on November 01, 2022    [provider]  predniSONE (STERAPRED UNI-PAK 21 TAB) 10 MG (21) TBPK tablet Take by mouth daily. Take 6 tabs by mouth daily  for 2 days, then decrease by 1 tablet every 2 days until finished on day 12. Patient not taking:  Reported on 01-Nov-2022 04/23/19   Katy Apo, NP     Critical care time: 68 minutes       Domingo Pulse Rust-Chester, AGACNP-BC Acute Care Nurse Practitioner [place]   [phone] / [phone] Please see Amion for pager details.

## 2022-10-13 NOTE — Progress Notes (Signed)
PHARMACY -  BRIEF ANTIBIOTIC NOTE   Pharmacy has received consult(s) for vancomycin and cefepime from an ED provider.  The patient's profile has been reviewed for ht/wt/allergies/indication/available labs.    One time order(s) placed for vancomycin 1250 mg IV x 1 and Cefepime 2 gm IV x 1  Further antibiotics/pharmacy consults should be ordered by admitting physician if indicated.                       Thank you, Alison Murray 10/26/2022  4:06 PM

## 2022-10-13 NOTE — Progress Notes (Signed)
CODE SEPSIS - PHARMACY COMMUNICATION  **Broad Spectrum Antibiotics should be administered within 1 hour of Sepsis diagnosis**  Time Code Sepsis Called/Page Received: 1555  Antibiotics Ordered: cefepime, vancomycin  Time of 1st antibiotic administration: 1635  Additional action taken by pharmacy: N/A     Alison Murray ,PharmD Clinical Pharmacist  10/11/2022  4:07 PM

## 2022-10-13 NOTE — Anesthesia Procedure Notes (Signed)
Arterial Line Insertion Performed by: Dimas Millin, MD, anesthesiologist  Patient location: OR. Preanesthetic checklist: patient identified, IV checked, site marked, risks and benefits discussed, surgical consent, monitors and equipment checked, pre-op evaluation, timeout performed and anesthesia consent Lidocaine 1% used for infiltration and patient sedated Left, radial was placed Catheter size: 20 G Hand hygiene performed  and maximum sterile barriers used   Attempts: 1 Procedure performed without using ultrasound guided technique. Following insertion, dressing applied and Biopatch. Post procedure assessment: normal and unchanged  Patient tolerated the procedure well with no immediate complications.

## 2022-10-13 NOTE — ED Notes (Signed)
Report given to OR, RN

## 2022-10-13 NOTE — Sepsis Progress Note (Signed)
Elink following code sepsis °

## 2022-10-13 NOTE — IPAL (Signed)
  Interdisciplinary Goals of Care Family Meeting   Date carried out: 10/16/2022  Location of the meeting: Bedside  Member's involved: Nurse Practitioner, Bedside Registered Nurse, and Family Member or next of kin  Durable Power of Attorney or acting medical decision maker: daughter, Caryl Asp   Discussion: We discussed goals of care for Colin Levine. Daughter and ex-wife bedside after being updated by Dr. Dahlia Byes regarding severity of the patient's abdominal injury after > 2 meters of small bowel resection during an emergent exploratory laparotomy. The patient would require multiple future abdominal surgeries as well as potential endovascular evaluations. His daughter confirmed that the patient would not want to be this aggressive and is requesting CODE STATUS change to DNR/DNI. She will call other family members to come and be with the patient tonight as they decide to move towards transitioning to comfort measures.   Code status:   Code Status: DNR   Disposition: Continue current acute care  Time spent for the meeting: 20 minutes    Renato Battles, NP  10/25/2022, 10:44 PM  Domingo Pulse Rust-Chester, AGACNP-BC Acute Care Nurse Practitioner [place]   [phone] / [phone] Please see Amion for pager details.

## 2022-10-13 NOTE — ED Notes (Signed)
Pt taken for surgery.

## 2022-10-13 NOTE — Progress Notes (Signed)
PHARMACY -  BRIEF ANTIBIOTIC NOTE   Pharmacy has received consult(s) for Zosyn from an ED provider.  The patient's profile has been reviewed for ht/wt/allergies/indication/available labs.    One time order(s) placed for Zosyn 3.375 gm IV x 1  Further antibiotics/pharmacy consults should be ordered by admitting physician if indicated.                       Thank you, Alison Murray 10/25/2022  5:30 PM

## 2022-10-13 NOTE — Transfer of Care (Signed)
Immediate Anesthesia Transfer of Care Note  Patient: Colin Levine  Procedure(s) Performed: EXPLORATORY LAPAROTOMY HERNIA REPAIR INGUINAL INCARCERATED (Right) APPLICATION OF WOUND VAC SMALL BOWEL RESECTION  Patient Location: ICU  Anesthesia Type:General  Level of Consciousness: Patient remains intubated per anesthesia plan  Airway & Oxygen Therapy: Patient remains intubated per anesthesia plan  Post-op Assessment: Report given to RN and Post -op Vital signs reviewed and stable  Post vital signs: Reviewed  Last Vitals:  Vitals Value Taken Time  BP 133/68   Temp 96.9   Pulse 98   Resp 14   SpO2 99     Last Pain:  Vitals:   Nov 05, 2022 1502  TempSrc: Oral         Complications: No notable events documented.

## 2022-10-13 NOTE — ED Triage Notes (Signed)
Pt comes in via [facility] from home with complaints of pt not responding as usual. According to the Pt's daughter, he was seen at the [place] yesterday for nausea and vomiting. Family states that his last known well was after New Years. Pt has a history of diabetes.  Pt's initial CBG was 365 en route. Pt has a 20 in the right forearm. Ot received a 528mL bag of NS for his low blood pressure.  Bp 90/156 Spo2 99% 97.0 Axillary temp   Pt responds to his name being called, with minimal response to anything else, and has grimaces to abdominal palpation

## 2022-10-13 NOTE — ED Notes (Signed)
Pt to Ct

## 2022-10-13 NOTE — Anesthesia Preprocedure Evaluation (Addendum)
Anesthesia Evaluation  Patient identified by MRN, date of birth, ID band Patient confused    Reviewed: Allergy & Precautions, NPO status , Patient's Chart, lab work & pertinent test results  History of Anesthesia Complications Negative for: history of anesthetic complications  Airway Mallampati: Unable to assess  TM Distance: >3 FB Neck ROM: full    Dental  (+) Dental Advidsory Given, Edentulous Lower, Edentulous Upper   Pulmonary neg pulmonary ROS, neg shortness of breath, neg COPD   Pulmonary exam normal        Cardiovascular hypertension, (-) angina + CAD and + CABG  Normal cardiovascular exam     Neuro/Psych negative neurological ROS  negative psych ROS   GI/Hepatic negative GI ROS, Neg liver ROS,,,  Endo/Other  diabetes    Renal/GU CRF and ARFRenal disease     Musculoskeletal   Abdominal   Peds  Hematology  (+) Blood dyscrasia, anemia   Anesthesia Other Findings Discussed the case with patient's daughter, the PoA. She stated she wanted everything done for her father. Discussed the increased risk of complications under anesthesia due to her father's condition including the risk of heart attack, stroke or the need for mechanical ventilation after the surgery had ended. She stated she understood and agreed to proceed with surgery.   Past Medical History: No date: Coronary artery disease No date: Diabetes mellitus without complication (HCC) No date: Hypertension  Past Surgical History: No date: ABDOMINAL SURGERY No date: bipass surgery No date: CARDIAC SURGERY No date: EYE SURGERY No date: FRACTURE SURGERY No date: NECK SURGERY  BMI    Body Mass Index: 22.89 kg/m      Reproductive/Obstetrics negative OB ROS                             Anesthesia Physical Anesthesia Plan  ASA: 4 and emergent  Anesthesia Plan: General ETT   Post-op Pain Management:    Induction:  Intravenous  PONV Risk Score and Plan: 3 and Ondansetron and Dexamethasone  Airway Management Planned: Oral ETT  Additional Equipment:   Intra-op Plan:   Post-operative Plan: Extubation in OR  Informed Consent: I have reviewed the patients History and Physical, chart, labs and discussed the procedure including the risks, benefits and alternatives for the proposed anesthesia with the patient or authorized representative who has indicated his/her understanding and acceptance.    Discussed DNR with power of attorney.   Dental Advisory Given and Consent reviewed with POA  Plan Discussed with: Anesthesiologist, CRNA and Surgeon  Anesthesia Plan Comments: (Patient's daughter consented for risks of anesthesia including but not limited to:  - adverse reactions to medications - damage to eyes, teeth, lips or other oral mucosa - nerve damage due to positioning  - sore throat or hoarseness - Damage to heart, brain, nerves, lungs, other parts of body or loss of life  Patient's daughter voiced understanding.)       Anesthesia Quick Evaluation

## 2022-10-13 NOTE — Anesthesia Procedure Notes (Signed)
Procedure Name: Intubation Date/Time: 10-29-2022 6:01 PM  Performed by: Rolla Plate, CRNAPre-anesthesia Checklist: Patient identified, Patient being monitored, Timeout performed, Emergency Drugs available and Suction available Patient Re-evaluated:Patient Re-evaluated prior to induction Oxygen Delivery Method: Circle system utilized Preoxygenation: Pre-oxygenation with 100% oxygen Induction Type: IV induction and Rapid sequence Laryngoscope Size: 3 and McGraph Grade View: Grade I Tube type: Oral Tube size: 7.5 mm Number of attempts: 1 Airway Equipment and Method: Stylet and Video-laryngoscopy Placement Confirmation: ETT inserted through vocal cords under direct vision, positive ETCO2 and breath sounds checked- equal and bilateral Secured at: 22 cm Tube secured with: Tape Dental Injury: Teeth and Oropharynx as per pre-operative assessment

## 2022-10-13 NOTE — Anesthesia Procedure Notes (Signed)
Central Venous Catheter Insertion Performed by: Dimas Millin, MD, anesthesiologist Start/End01/12/24 6:30 PM, 21-Oct-2022 7:01 PM Patient location: OR. Preanesthetic checklist: patient identified, IV checked, site marked, risks and benefits discussed, surgical consent, monitors and equipment checked, pre-op evaluation, timeout performed and anesthesia consent Lidocaine 1% used for infiltration and patient sedated Hand hygiene performed  and maximum sterile barriers used  Catheter size: 8 Fr Total catheter length 16. Central line was placed.Double lumen Procedure performed using ultrasound guided technique. Ultrasound Notes:anatomy identified, needle tip was noted to be adjacent to the nerve/plexus identified, no ultrasound evidence of intravascular and/or intraneural injection and image(s) printed for medical record Attempts: 1 Following insertion, dressing applied, line sutured and Biopatch. Post procedure assessment: blood return through all ports, no air and free fluid flow  Patient tolerated the procedure well with no immediate complications.

## 2022-10-13 NOTE — Consult Note (Signed)
Patient ID: Colin Levine, male   DOB: June 29, 1951, 72 y.o.   MRN: [id number]  HPI Colin Levine is a 72 y.o. male seen in consultation at the request of Dr. Kerman Passey from the emergency room.  He came in after EMS brought him in secondary to altered mental status.  Apparently patient is infected and went to the [facility] yesterday for nausea and vomiting and was sent home.  This report is from the daughter that talk to the neighbor. Of the history is taken from the daughter who is at bedside and from the emergency room.  He also was hypotensive and has been agitated.  He unable to answer questions. Did have appropriate CT scan of the abdomen pelvis showing evidence of dilated loops of small bowel and what seems to be a right incarcerated inguinal hernia with no for small bowel with signs of obstruction.  There is no free air.  There is ascites.  He had a CT of the head without any acute issues. Also has an increased white count and elevated lactate, INR is normal.  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  He takes aspirin.  He also has a history of peripheral vascular disease and has lower extremity stents.he came in 6 months ago w similar sxs but to a lesser degree. Glucose 425, CO2 18, WBC 25.2 w left shift. He is independent at home and lives alone  HPI  Past Medical History:  Diagnosis Date   Coronary artery disease    Diabetes mellitus without complication ([place])    Hypertension     Past Surgical History:  Procedure Laterality Date   ABDOMINAL SURGERY     bipass surgery     CARDIAC SURGERY     EYE SURGERY     FRACTURE SURGERY     NECK SURGERY      Family History  Problem Relation Age of Onset   Diabetes Mother    Lupus Mother    Cancer Brother     Social History Social History   Tobacco Use   Smoking status: Never   Smokeless tobacco: Never  Substance Use Topics   Alcohol use: Never   Drug use: Never    Allergies  Allergen  Reactions   Liraglutide Diarrhea and Nausea Only    Other reaction(s): Abdominal pain, Loss of appetite    Current Facility-Administered Medications  Medication Dose Route Frequency Provider Last Rate Last Admin   0.9 %  sodium chloride infusion  250 mL Intravenous PRN Flora Lipps, MD       ceFEPIme (MAXIPIME) 2 g in sodium chloride 0.9 % 100 mL IVPB  2 g Intravenous Once Harvest Dark, MD 200 mL/hr at 11/01/2022 1635 2 g at 11/04/2022 1635   dextrose 5 % in lactated ringers infusion   Intravenous Continuous Harvest Dark, MD       dextrose 50 % solution 0-50 mL  0-50 mL Intravenous PRN Harvest Dark, MD       heparin injection 5,000 Units  5,000 Units Subcutaneous Q8H Kasa, Kurian, MD       insulin regular, human (MYXREDLIN) 100 units/ 100 mL infusion   Intravenous Continuous Harvest Dark, MD       lactated ringers bolus 1,406 mL  20 mL/kg Intravenous Once Harvest Dark, MD       lactated ringers infusion   Intravenous Continuous Harvest Dark, MD       metroNIDAZOLE (FLAGYL) IVPB 500 mg  500 mg Intravenous Once Minna Antis, MD       ondansetron [facility] injection 4 mg  4 mg Intravenous Q6H PRN Erin Fulling, MD       potassium chloride 10 mEq in 100 mL IVPB  10 mEq Intravenous Q1H Minna Antis, MD       sodium chloride 0.9 % bolus 1,000 mL  1,000 mL Intravenous Once Minna Antis, MD       sodium chloride flush (NS) 0.9 % injection 3 mL  3 mL Intravenous Q12H Erin Fulling, MD       sodium chloride flush (NS) 0.9 % injection 3 mL  3 mL Intravenous PRN Erin Fulling, MD       vancomycin (VANCOREADY) IVPB 1250 mg/250 mL  1,250 mg Intravenous Once Manfred Shirts, RPH 166.7 mL/hr at 2022/10/29 1641 1,250 mg at 10/29/22 1641   Current Outpatient Medications  Medication Sig Dispense Refill   acetaminophen (TYLENOL) 325 MG tablet Take 650 mg by mouth every 6 (six) hours as needed.     aspirin 81 MG chewable tablet Chew by mouth daily.      atenolol-chlorthalidone (TENORETIC) 50-25 MG tablet Take 1 tablet by mouth daily.     atorvastatin (LIPITOR) 40 MG tablet Take 40 mg by mouth daily.     buPROPion (WELLBUTRIN) 100 MG tablet Take 100 mg by mouth 2 (two) times daily.     cetirizine (ZYRTEC) 10 MG tablet Take 10 mg by mouth daily.     isosorbide mononitrate (IMDUR) 30 MG 24 hr tablet Take 30 mg by mouth daily.     levothyroxine (SYNTHROID) 50 MCG tablet Take 50 mcg by mouth daily before breakfast.     lisinopril (ZESTRIL) 40 MG tablet Take 40 mg by mouth daily.     metFORMIN (GLUCOPHAGE) 500 MG tablet Take 500 mg by mouth daily.      mirtazapine (REMERON) 7.5 MG tablet Take 7.5 mg by mouth at bedtime.     prazosin (MINIPRESS) 5 MG capsule Take 5 mg by mouth at bedtime.     predniSONE (STERAPRED UNI-PAK 21 TAB) 10 MG (21) TBPK tablet Take by mouth daily. Take 6 tabs by mouth daily  for 2 days, then decrease by 1 tablet every 2 days until finished on day 12. 42 tablet 0   sennosides-docusate sodium (SENOKOT-S) 8.6-50 MG tablet Take 1 tablet by mouth daily.     sertraline (ZOLOFT) 100 MG tablet Take 100 mg by mouth daily.     sotalol (BETAPACE) 80 MG tablet Take 80 mg by mouth 2 (two) times daily.       Review of Systems Unable to obtain full ROS given encephalopathy  Physical Exam Blood pressure 111/82, pulse 95, temperature (!) 97.4 F (36.3 C), temperature source Oral, resp. rate (!) 24, height 5\' 9"  (1.753 m), weight 70.3 kg, SpO2 100 %. CONSTITUTIONAL: agitated and confused clearly encephalopathic. EYES: Pupils are equal, round,  Sclera are non-icteric. EARS, NOSE, MOUTH AND THROAT: The oral mucosa is pink and moist.  LYMPH NODES:  Lymph nodes in the neck are normal. RESPIRATORY:  Lungs are clear. There is normal respiratory effort, with equal breath sounds bilaterally, and without pathologic use of accessory muscles. CARDIOVASCULAR: Heart is regular without murmurs, gallops, or rubs. GI: The abdomen is firm there is  obvious rebound and classic peritonitis.  He does also have an incarcerated right inguinal hernia that is exquisitely tender to palpation.  I am unable to reduce it. GU: Rectal deferred.   MUSCULOSKELETAL:  Normal muscle strength and tone. No cyanosis or edema.   SKIN: Turgor is good and there are no pathologic skin lesions or ulcers. NEUROLOGIC/PSYCH: He is lethargic and confused.  No focal neurological deficits.  He is unable to follow commands or answer questions Data Reviewed  I have personally reviewed the patient's imaging, laboratory findings and medical records.    Assessment/Plan 72 year old male with an acute abdomen likely from obstruction from incarcerated right inguinal hernia.  He is very sick and in shock with encephalopathy.  He also has some demand ischemia reflected on increased on troponin.  I do think the culprit of all his medical issues is the obstruction and the abdominal pathology.  I do think that he warrants emergent surgical intervention in the form of potential right inguinal exploration and possible laparotomy with small bowel resection.  I had an extensive discussion with the daughter who is at the bedside.  Given all his issues significant risk factor for perioperative morbidity and mortality.  I was very candid with family regarding potential issues to include cardiovascular collapse, respiratory failure prolonged ICU stay, prolonged hospitalization and complications related to major abdominal emergent operations.  UnFortunately I do not think that we have much of a choice, if we do not do emergency surgery he will die. Family is fully aware and wishes to proceed. I will start broad spectrum antibiotics, type and cross and continue fluid resuscitation.  I spent 75 minutes in this encounter including personally reviewing imaging studies, coordinating her care, placing orders, counseling the patient and performing a proper documentation     Caroleen Hamman, MD  FACS General Surgeon 11/09/2022, 5:02 PM

## 2022-10-13 NOTE — ED Provider Notes (Signed)
[facility] Provider Note    Event Date/Time   First MD Initiated Contact with Patient 11/11/22 1504     (approximate)  History   Chief Complaint: Altered Mental Status  HPI  Colin Levine is a 72 y.o. male with a past medical history of CAD, diabetes, hypertension, presents emergency department for altered mental status.  According to EMS patient is coming from home for decreased responsiveness/altered mental status.  According to EMS the daughter states the patient was at the [place] yesterday due to nausea and vomiting.  Also is diabetic.  EMS reports a CBG of 365 has received 500 cc of fluid prior to arrival for borderline hypotension around 90 systolic.  Currently 267 systolic on arrival with a 97.4 rectal temperature.  Patient will answer when you call his name but he answers "yes" to every question that is asked.  Patient appears somnolent, weak and confused.  Physical Exam   Triage Vital Signs: ED Triage Vitals  Enc Vitals Group     BP Nov 11, 2022 1502 133/82     Pulse Rate Nov 11, 2022 1502 92     Resp 11-Nov-2022 1502 (!) 25     Temp 11-11-2022 1502 (!) 97.4 F (36.3 C)     Temp Source 11-11-22 1502 Oral     SpO2 2022-11-11 1502 99 %     Weight Nov 11, 2022 1510 154 lb 15.7 oz (70.3 kg)     Height 11-11-2022 1510 5\' 9"  (1.753 m)     Head Circumference --      Peak Flow --      Pain Score --      Pain Loc --      Pain Edu? --      Excl. in [place]? --     Most recent vital signs: Vitals:   11/11/22 1502  BP: 133/82  Pulse: 92  Resp: (!) 25  Temp: (!) 97.4 F (36.3 C)  SpO2: 99%    General: Somnolent but will respond when he called his name.  Will answer most questions but states "yes" as answer regardless of the question.  Appears confused and weak. CV:  Good peripheral perfusion.  Regular rate and rhythm  Resp:  Normal effort.  Equal breath sounds bilaterally.  Abd:  No distention.  Soft but patient grimaces to abdominal palpation.  Patient has a very large right  inguinal hernia present that is not reducible on my attempt. Other:  Slight cyanosis of the fingertips.   ED Results / Procedures / Treatments   EKG  EKG viewed and interpreted by myself shows a normal sinus rhythm 87 bpm with a slightly widened QRS, normal axis slight QTc prolongation otherwise normal intervals.  Patient has a deep lateral inverted T waves.  No ST elevation.  Inverted T waves are new compared to 04/24/2022.  RADIOLOGY  I have reviewed and interpreted the chest x-ray images.  Low lung volumes no obvious consolidation seen on my evaluation. Radiology is read the chest x-ray is bibasilar atelectasis.  MEDICATIONS ORDERED IN ED: Medications  lactated ringers bolus 1,000 mL (1,000 mLs Intravenous New Bag/Given November 11, 2022 1522)     IMPRESSION / MDM / ASSESSMENT AND PLAN / ED COURSE  I reviewed the triage vital signs and the nursing notes.  Patient's presentation is most consistent with acute presentation with potential threat to life or bodily function.  Patient presents emergency department for altered mental status/confusion.  On exam patient appears somewhat cyanotic to the fingertips has likely abdominal pain  as he grimaces to abdominal palpation.  He has a large nonreducible right inguinal hernia.  We will obtain an emergent CT imaging of the head abdomen and pelvis.  Chest x-ray does not appear to show any concerning abnormality.  Lab work is pending.  Differential is quite broad but would include metabolic or electrolyte abnormality, rhabdomyolysis, renal insufficiency, infectious etiology, necrotic bowel, SBO.  We will begin IV hydration.  We will obtain CT imaging of abdomen/pelvis and head to further evaluate.  EKG showing deep inverted T waves could be indicative of cardiac disease or neurologic disease such as CVA or ICH.  Patient's CT has resulted showing a right inguinal hernia leading to bowel obstruction there is also signs of mesenteric edema as well as new ascites  concerning for ischemic bowel.  Patient's labs have resulted showing a lactic acid of 5.8 a VBG pH of 7.22.  On chemistry patient has acute renal insufficiency with a creatinine of 2.7 from a baseline of 1.5 patient's glucose is over 400 with an anion gap of 20, likely indicating diabetic ketoacidosis in addition to the patient's ischemia/bowel obstruction.  Patient's troponin has resulted at greater than 850, unclear if this is due to ACS or related to the patient's other medical issues and metabolic acidosis.  Patient's CK is over 700 indicating rhabdomyolysis.  Patient is receiving broad-spectrum antibiotics, receiving IV fluids is currently on insulin infusion.  I spoke to Dr.Pabon of general surgery who has been down and evaluated the patient.  However unable to reduce the hernia at bedside.  Discussed risks of surgery with daughter who wishes for the patient to go the OR for attempted hernia reduction.  I spoke to Dr. Marchia Bond of the intensive care unit who will admit the patient to his service after the operating room.  I have updated the daughter on the patient's poor prognosis.  CRITICAL CARE Performed by: Harvest Dark   Total critical care time: 60 minutes  Critical care time was exclusive of separately billable procedures and treating other patients.  Critical care was necessary to treat or prevent imminent or life-threatening deterioration.  Critical care was time spent personally by me on the following activities: development of treatment plan with patient and/or surrogate as well as nursing, discussions with consultants, evaluation of patient's response to treatment, examination of patient, obtaining history from patient or surrogate, ordering and performing treatments and interventions, ordering and review of laboratory studies, ordering and review of radiographic studies, pulse oximetry and re-evaluation of patient's condition.   FINAL CLINICAL IMPRESSION(S) / ED DIAGNOSES   Altered  mental status Confusion Mall bowel obstruction Diabetic ketoacidosis Rhabdomyolysis Renal insufficiency   Note:  This document was prepared using Dragon voice recognition software and may include unintentional dictation errors.   Harvest Dark, MD November 04, 2022 1746

## 2022-10-13 NOTE — Op Note (Signed)
PROCEDURES: Laparotomy with small bowel resection Repair incarcerated Inguinal hernia Temporary abdominal wound closure using Abthera Wound vac ( measures 25x3 cms midline and 8 cm Right lower quadrant)  Pre-operative Diagnosis: Acute abdomen  Post-operative Diagnosis: Death bowel and incarcerated Right inguinal hernia  Surgeon: McClelland   Anesthesia: General endotracheal anesthesia  ASA Class: 4   Surgeon: Caroleen Hamman , MD FACS  Anesthesia: Gen. with endotracheal tube  Findings: Necrotic bowel 2.5 mts Proximal margin of resection 25 cms from treitz and distal margin 1 meter from ileocecal valve Open abdomen  Estimated Blood Loss: 50cc               Specimens: small bowel and sac          Complications: none         Condition: critically ill  Procedure Details  The patient was seen again in the Holding Room. The benefits, complications, treatment options, and expected outcomes were discussed with the patient. The risks of bleeding, infection, recurrence of symptoms, failure to resolve symptoms,  bowel injury, any of which could require further surgery were reviewed with the patient.   The patient was taken to Operating Room, identified as Colin Levine and the procedure verified.  A Time Out was held and the above information confirmed.  Prior to the induction of general anesthesia, antibiotic prophylaxis was administered. VTE prophylaxis was in place. General endotracheal anesthesia was then administered and tolerated well. After the induction, the abdomen was prepped with Chloraprep and draped in the sterile fashion. The patient was positioned in the supine position. Started with a right inguinal incision since he had an incarcerated hernia.  The external oblique fascia was divided using cautery.  I was able to extend the defect with electrocautery and was able to reduced the hernia.  The hernia contained a piece of small bowel.  It was edematous but not necrotic.  I was able  to continue to examine the bowel and saw obvious evidence of necrosis and bowel ischemia.  At this time I realized that he had no other intra-abdominal process.  Midline laparotomy was performed with a 10 blade knife and electrocautery was used to dissect through subcutaneous tissue.  The fascia was elevated and incised.  The abdominal cavity was entered under direct visualization without evidence of injuries.  Exploration revealed evidence of a very massive segment of the small bowel that was necrotic.  The proximal semiviable bowel was about 25 cm from the ligament of Treitz and the distal margin was about 90 to 100 cm from the ligament of Treitz this was again very questionable viability.  I was able to create proximal and distal mesenteric defect using electrocautery and the proximal distal bowel were divided with a GIA stapler 75 mm.  The mesentery was divided with LigaSure device.  Please note that there was no evidence of good perfusion from the main ileal and jejunal arteries.  Also there was evidence of fetid and portrayed purulent fluid within the abdominal cavity.  I irrigated the abdominal cavity with 3 L of saline.  Attention then was turned to the right inguinal region where a primary tissue repair was performed by approximated the shelving edge of the inguinal ligament to the conjoined tender in the standard fashion using multiple Ethibond sutures.  Because of the patient hemodynamic instability and very of the disease I decided not to do an abdominal closure and use an ABThera wound VAC as a temporary abdominal wound closure device.  This was used in the standard fashion and I was able to bridge the ABThera wound VAC to the right lower quadrant incision with the same sponge.  I connected the wound VAC to the suction without any leaks.  Needle and laparotomy count were correct and there were no immediate complications  He is critically ill and I will have my serious doubt that this is a survivable  injury.  I  had an extensive discussion with the daughter and the son-in-law who were clear about the patient wishes regarding end of life issues.  I did explain the severity of the situation and that the patient would likely have to have multiple abdominal explorations and potential endovascular evaluations with potential stent.  Best case scenario he will develop short gut syndrome and be TPN dependent with prolonged hospitalization.  It is  clear that Colin Levine  who is the patient did not want to have this kind of heroic measures done and this will significantly impair his  quality of life.  I encouraged the daughter to have further discussions with the family.  For tonight we will remain full code but may likely transition to comfort care tomorrow  Caroleen Hamman, MD, FACS

## 2022-10-13 NOTE — ED Notes (Signed)
Pt 's daughter at the bedside.

## 2022-10-14 ENCOUNTER — Encounter: Payer: Self-pay | Admitting: Surgery

## 2022-10-14 DIAGNOSIS — N179 Acute kidney failure, unspecified: Secondary | ICD-10-CM | POA: Insufficient documentation

## 2022-10-14 DIAGNOSIS — L89156 Pressure-induced deep tissue damage of sacral region: Secondary | ICD-10-CM | POA: Insufficient documentation

## 2022-10-14 DIAGNOSIS — R7989 Other specified abnormal findings of blood chemistry: Secondary | ICD-10-CM | POA: Diagnosis present

## 2022-10-14 DIAGNOSIS — Z9911 Dependence on respirator [ventilator] status: Secondary | ICD-10-CM

## 2022-10-14 LAB — URINE CULTURE: Culture: NO GROWTH

## 2022-10-14 LAB — GLUCOSE, CAPILLARY: Glucose-Capillary: 328 mg/dL — ABNORMAL HIGH (ref 70–99)

## 2022-10-14 LAB — PREPARE RBC (CROSSMATCH)

## 2022-10-14 MED ORDER — GLYCOPYRROLATE 0.2 MG/ML IJ SOLN: 0.2000 mg | INTRAMUSCULAR | Status: DC | PRN

## 2022-10-14 MED ORDER — POLYVINYL ALCOHOL 1.4 % OP SOLN: 1.0000 [drp] | Freq: Four times a day (QID) | OPHTHALMIC | Status: DC | PRN

## 2022-10-14 MED ORDER — MORPHINE SULFATE (PF) 2 MG/ML IV SOLN: 2.0000 mg | INTRAVENOUS | Status: DC | PRN

## 2022-10-14 MED ORDER — GLYCOPYRROLATE 1 MG PO TABS: 1.0000 mg | ORAL_TABLET | ORAL | Status: DC | PRN

## 2022-10-14 MED ORDER — SODIUM CHLORIDE 0.9 % IV SOLN: INTRAVENOUS | Status: DC

## 2022-10-14 MED ORDER — ACETAMINOPHEN 325 MG PO TABS: 650.0000 mg | ORAL_TABLET | Freq: Four times a day (QID) | ORAL | Status: DC | PRN

## 2022-10-14 MED ORDER — ACETAMINOPHEN 650 MG RE SUPP: 650.0000 mg | Freq: Four times a day (QID) | RECTAL | Status: DC | PRN

## 2022-10-14 MED ORDER — MORPHINE BOLUS VIA INFUSION: 5.0000 mg | INTRAVENOUS | Status: DC | PRN

## 2022-10-14 MED ORDER — MORPHINE SULFATE (PF) 2 MG/ML IV SOLN
INTRAVENOUS | Status: AC
  Administered 2022-10-14: 2 mg via INTRAVENOUS
  Filled 2022-10-14: qty 1

## 2022-10-14 MED ORDER — MIDAZOLAM HCL 2 MG/2ML IJ SOLN: 2.0000 mg | INTRAMUSCULAR | Status: DC | PRN

## 2022-10-14 MED ORDER — MORPHINE 100MG IN NS 100ML (1MG/ML) PREMIX INFUSION
0.0000 mg/h | INTRAVENOUS | Status: DC
  Administered 2022-10-14: 5 mg/h via INTRAVENOUS
  Filled 2022-10-14: qty 100

## 2022-10-17 LAB — SURGICAL PATHOLOGY

## 2022-10-17 NOTE — Anesthesia Postprocedure Evaluation (Addendum)
Anesthesia Post Note  Patient: Colin Levine  Procedure(s) Performed: EXPLORATORY LAPAROTOMY HERNIA REPAIR INGUINAL INCARCERATED (Right) APPLICATION OF WOUND VAC SMALL BOWEL RESECTION  Patient location: deceased. Anesthesia Type: General Post-procedure mental status: deceased. Pain control: deceased. Respiratory status: deceased. Cardiovascular status: deceased. : deceased. Anesthetic complications: no Comments: Patient passed away several hours after stable hand off in the ICU.   No notable events documented.   Last Vitals:  Vitals:   10/18/2022 0045 11/05/2022 0100  BP:    Pulse: 93 (!) 108  Resp: 18 (!) 26  Temp: (!) 38.1 C (!) 38.3 C  SpO2: 100% (!) 77%    Last Pain:  Vitals:   October 26, 2022 2200  TempSrc: Rectal                 Dimas Millin

## 2022-10-18 LAB — CULTURE, BLOOD (ROUTINE X 2)
Culture: NO GROWTH
Culture: NO GROWTH
Special Requests: ADEQUATE

## 2022-11-10 NOTE — Progress Notes (Signed)
Supportive Care for family after patient extubation. Daughter and mother of daughter present bedside. Family shared Psalms 9 was favorite to patient, which we recited bedside after I offered prayer. Asked to be paged if any additional support is needed.

## 2022-11-10 NOTE — Death Summary Note (Signed)
DEATH SUMMARY   Patient Details  Name: Colin Levine MRN: [id number] DOB: 11-Dec-1950  Admission/Discharge Information   Admit Date:  10/28/22  Date of Death: Date of Death: October 29, 2022  Time of Death: Time of Death: 0120  Length of Stay: 1  Referring Physician: [facility], [place]   Reason(s) for Hospitalization  AMS, SBO and incarcerated hernia & DKA  Diagnoses  Preliminary cause of death: SBO & incarcerated hernia Secondary Diagnoses (including complications and co-morbidities):  Principal Problem:   Hernia with obstruction Active Problems:   Acidosis, lactic   Metabolic encephalopathy   ATN (acute tubular necrosis) (HCC)   DKA (diabetic ketoacidosis) (HCC)   Bowel obstruction (HCC)   Elevated troponin   AKI (acute kidney injury) ([place])   Pressure injury of deep tissue of sacral region   On mechanically assisted ventilation [facility])   Brief Hospital Course (including significant findings, care, treatment, and services provided and events leading to death)  Colin Levine is a 72 y.o. year old male coming from home for decreased responsiveness/altered mental status.  According to EMS the daughter states the patient was at the [place] yesterday due to nausea and vomiting.  Also is diabetic.  EMS reports a CBG of 365 has received 500 cc of fluid prior to arrival for borderline hypotension around 90 systolic.   ED course: Upon arrival patient had 629 systolic with a 47.6 rectal temperature.  Patient will answer when you call his name but he answers "yes" to every question that is asked.  Patient appears somnolent, weak and confused. Lab work suggestive of sepsis, DKA with elevated troponin, AKI, AGMA, lactic acidosis, leukocytosis & hyperglycemia. Imaging significant for large hernia and SBO. General surgery evaluated and decision to take the patient emergently for exploratory laparotomy.  Patient intubated for exploratory laparotomy and small bowel resection, abdomen left open with wound  vac in place. Patient remaining intubated requiring post op mechanical ventilatory support. PCCM consulted for admission. Post-operatively, surgery discussed the severity of the patient's abdominal injury reporting he would need multiple follow up surgical interventions and potentially endovascular intervention. Daughter, Caryl Asp, reported that her father would not want to undergo this many interventions and the decision was made to transition the patient to comfort measures. He passed with family present shortly after extubation.  Pertinent Labs and Studies  Significant Diagnostic Studies DG Abd 1 View  Result Date: 10/28/2022 CLINICAL DATA:  Line placement EXAM: ABDOMEN - 1 VIEW COMPARISON:  06/13/2022 FINDINGS: Nonobstructed gas pattern. Esophageal tube side-port in the region of GE junction. Tip overlies proximal stomach. IMPRESSION: Esophageal tube side-port in the region of GE junction. Tip overlies proximal stomach. Consider further advancement by 5-10 cm for more optimal positioning Electronically Signed   By: Donavan Foil M.D.   On: 28-Oct-2022 20:04   DG Chest Port 1 View  Result Date: 10-28-22 CLINICAL DATA:  Central line placement EXAM: PORTABLE CHEST 1 VIEW COMPARISON:  10/28/2022 FINDINGS: Interval placement of endotracheal tube, tip below the thoracic inlet. Esophagogastric tube with tip below the diaphragm, side port at the level of the gastroesophageal junction. Right neck vascular catheter, tip over the superior portion of the SVC. Cardiomegaly status post median sternotomy. No acute abnormality of the lungs. Osseous structures unremarkable. IMPRESSION: 1. Interval placement of endotracheal tube, tip below the thoracic inlet. 2. Esophagogastric tube with tip below the diaphragm, side port at the level of the gastroesophageal junction. Consider advancement to ensure subdiaphragmatic position. 3. Right neck vascular catheter, tip over the superior portion  of the SVC. 4. Cardiomegaly. 5. No  acute abnormality of the lungs. Electronically Signed   By: Jearld Lesch M.D.   On: 10/11/2022 20:04   CT ABDOMEN PELVIS WO CONTRAST  Result Date: 10/21/2022 CLINICAL DATA:  72 year old with abdominal pain, acute, nonlocalized. EXAM: CT ABDOMEN AND PELVIS WITHOUT CONTRAST TECHNIQUE: Multidetector CT imaging of the abdomen and pelvis was performed following the standard protocol without IV contrast. RADIATION DOSE REDUCTION: This exam was performed according to the departmental dose-optimization program which includes automated exposure control, adjustment of the mA and/or kV according to patient size and/or use of iterative reconstruction technique. COMPARISON:  CT abdomen and pelvis 04/24/2022 FINDINGS: Lower chest: Prior median sternotomy. Densities is in the posterior left lower lobe are most compatible with volume loss and atelectasis. No significant pleural fluid. Hepatobiliary: Gallbladder is distended and evidence of cholelithiasis. Small amount of perihepatic ascites along the right hepatic lobe. No gross liver abnormality. Pancreas: Unremarkable. No pancreatic ductal dilatation or surrounding inflammatory changes. Spleen: There is some artifact along the superior aspect of the spleen but no gross abnormality to the spleen. Adrenals/Urinary Tract: Normal adrenal glands. Negative for kidney stones. Urinary bladder is severely distended. No hydronephrosis and no ureter dilatation. Stomach/Bowel: Normal stomach. Dilated loops of small bowel in the left abdomen measuring up to 3.7 cm. Large right inguinal hernia containing loops of small bowel. The inguinal hernia site is incompletely imaged this examination. Evidence for a normal appendix in the right lower quadrant just above the right inguinal hernia. No gross abnormality to the colon. Vascular/Lymphatic: Diffuse vascular calcifications in the aorta and iliac arteries. Negative for abdominal aortic aneurysm. No significant lymph node enlargement in the  abdomen or pelvis. Reproductive: Prostate is prominent for size and stable. Other: New abdominal ascites around the liver and left upper quadrant. New mesenteric edema. Negative for free air. Musculoskeletal: Multilevel degenerative changes in lumbar spine. No acute bone abnormality. IMPRESSION: 1. Dilated loops of bowel in the left abdomen. Findings are concerning for at least a partial small bowel obstruction. Large right inguinal hernia containing loops of bowel but the inguinal hernia is not completely imaged. Obstruction could be associated with this large inguinal hernia. 2. Mesenteric edema and small amount of abdominal ascites. These findings are new since 04/24/2022 and could be related to a bowel obstruction. 3. Dilated gallbladder with cholelithiasis. Cholecystitis cannot be excluded. 4. Patchy densities at the left lung base likely associated with atelectasis. Small focus of infection or aspiration is also in the differential diagnosis. 5. Severe distension of the urinary bladder without hydronephrosis. Electronically Signed   By: Richarda Overlie M.D.   On: 11/09/2022 16:11   CT HEAD WO CONTRAST ( )  Result Date: 11/07/2022 CLINICAL DATA:  Altered mental status EXAM: CT HEAD WITHOUT CONTRAST TECHNIQUE: Contiguous axial images were obtained from the base of the skull through the vertex without intravenous contrast. RADIATION DOSE REDUCTION: This exam was performed according to the departmental dose-optimization program which includes automated exposure control, adjustment of the mA and/or kV according to patient size and/or use of iterative reconstruction technique. COMPARISON:  04/24/2022 FINDINGS: Brain: No acute intracranial findings are seen. There are no signs of bleeding within the cranium. Cortical sulci are prominent. There is decreased density in periventricular white matter. There is no focal edema or mass effect. Vascular: Scattered arterial calcifications are seen. Skull: Unremarkable.  Sinuses/Orbits: There is mucosal thickening in the ethmoid and sphenoid sinuses. Other: No significant interval changes are noted. IMPRESSION: No acute intracranial  findings are seen in noncontrast CT brain. Atrophy. Small-vessel disease. Chronic sinusitis. Electronically Signed   By: Ernie Avena M.D.   On: 11/07/2022 15:54   DG Chest Port 1 View  Result Date: 10/29/2022 CLINICAL DATA:  Questionable sepsis, nausea and vomiting EXAM: PORTABLE CHEST 1 VIEW COMPARISON:  Portable exam 1518 hours compared to 04/24/2022 FINDINGS: Upper normal heart size post CABG. Mediastinal contours and pulmonary vascularity normal. Mild bibasilar atelectasis greater on LEFT. Lungs otherwise clear. No infiltrate, pleural effusion, or pneumothorax. Osseous structures unremarkable. IMPRESSION: Bibasilar atelectasis. Electronically Signed   By: Ulyses Southward M.D.   On: 11/02/2022 15:26    Microbiology Recent Results (from the past 240 hour(s))  Resp panel by RT-PCR (RSV, Flu A&B, Covid) Anterior Nasal Swab     Status: None   Collection Time: 11/01/2022  3:21 PM   Specimen: Anterior Nasal Swab  Result Value Ref Range Status   SARS Coronavirus 2 by RT PCR NEGATIVE NEGATIVE Final    Comment: (NOTE) SARS-CoV-2 target nucleic acids are NOT DETECTED.  The SARS-CoV-2 RNA is generally detectable in upper respiratory specimens during the acute phase of infection. The lowest concentration of SARS-CoV-2 viral copies this assay can detect is 138 copies/mL. A negative result does not preclude SARS-Cov-2 infection and should not be used as the sole basis for treatment or other patient management decisions. A negative result may occur with  improper specimen collection/handling, submission of specimen other than nasopharyngeal swab, presence of viral mutation(s) within the areas targeted by this assay, and inadequate number of viral copies(<138 copies/mL). A negative result must be combined with clinical observations, patient  history, and epidemiological information. The expected result is Negative.  Fact Sheet for Patients:  BloggerCourse.com  Fact Sheet for Healthcare Providers:  SeriousBroker.it  This test is no t yet approved or cleared by the [place] FDA and  has been authorized for detection and/or diagnosis of SARS-CoV-2 by FDA under an Emergency Use Authorization (EUA). This EUA will remain  in effect (meaning this test can be used) for the duration of the COVID-19 declaration under Section 564(b)(1) of the Act, 21 U.S.C.section 360bbb-3(b)(1), unless the authorization is terminated  or revoked sooner.       Influenza A by PCR NEGATIVE NEGATIVE Final   Influenza B by PCR NEGATIVE NEGATIVE Final    Comment: (NOTE) The Xpert Xpress SARS-CoV-2/FLU/RSV plus assay is intended as an aid in the diagnosis of influenza from Nasopharyngeal swab specimens and should not be used as a sole basis for treatment. Nasal washings and aspirates are unacceptable for Xpert Xpress SARS-CoV-2/FLU/RSV testing.  Fact Sheet for Patients: BloggerCourse.com  Fact Sheet for Healthcare Providers: SeriousBroker.it  This test is not yet approved or cleared by the [place] FDA and has been authorized for detection and/or diagnosis of SARS-CoV-2 by FDA under an Emergency Use Authorization (EUA). This EUA will remain in effect (meaning this test can be used) for the duration of the COVID-19 declaration under Section 564(b)(1) of the Act, 21 U.S.C. section 360bbb-3(b)(1), unless the authorization is terminated or revoked.     Resp Syncytial Virus by PCR NEGATIVE NEGATIVE Final    Comment: (NOTE) Fact Sheet for Patients: BloggerCourse.com  Fact Sheet for Healthcare Providers: SeriousBroker.it  This test is not yet approved or cleared by the [place] FDA  and has been authorized for detection and/or diagnosis of SARS-CoV-2 by FDA under an Emergency Use Authorization (EUA). This EUA will remain in effect (meaning this test can be used)  for the duration of the COVID-19 declaration under Section 564(b)(1) of the Act, 21 U.S.C. section 360bbb-3(b)(1), unless the authorization is terminated or revoked.  Performed at [facility], [address] [place], [place] [address]   MRSA Next Gen by PCR, Nasal     Status: None   Collection Time: Oct 23, 2022  7:54 PM   Specimen: Nasal Mucosa; Nasal Swab  Result Value Ref Range Status   MRSA by PCR Next Gen NOT DETECTED NOT DETECTED Final    Comment: (NOTE) The GeneXpert MRSA Assay (FDA approved for NASAL specimens only), is one component of a comprehensive MRSA colonization surveillance program. It is not intended to diagnose MRSA infection nor to guide or monitor treatment for MRSA infections. Test performance is not FDA approved in patients less than 7 years old. Performed at [facility], [address] [place], [place] [address]     Lab Basic Metabolic Panel: Recent Labs  Lab 23-Oct-2022 1517 10/23/22 2009  NA 140 138  K 4.4 3.7  CL 102 110  CO2 18* 16*  GLUCOSE 425* 326*  BUN 62* 59*  CREATININE 2.72* 2.08*  CALCIUM 8.5* 7.3*  MG  --  1.8  PHOS  --  6.1*   Liver Function Tests: Recent Labs  Lab 10-23-2022 1517  AST 57*  ALT 32  ALKPHOS 81  BILITOT 1.0  PROT 6.6  ALBUMIN 3.2*   No results for input(s): "LIPASE", "AMYLASE" in the last 168 hours. No results for input(s): "AMMONIA" in the last 168 hours. CBC: Recent Labs  Lab 2022-10-23 1517 10-23-22 2009  WBC 25.2* 11.9*  NEUTROABS 22.4* 11.0*  HGB 11.9* 9.3*  HCT 41.0 31.0*  MCV 74.7* 72.6*  PLT 236 161   Cardiac Enzymes: Recent Labs  Lab Oct 23, 2022 1517  CKTOTAL 776*   Sepsis Labs: Recent Labs  Lab 2022-10-23 1517 23-Oct-2022 1521 10-23-2022 2009  PROCALCITON 38.24  --   --   WBC 25.2*  --   11.9*  LATICACIDVEN  --  5.8* 2.7*    Procedures/Operations  10/13/21: ETT placement 10/13/21: Arterial line placement 10/13/21: CVC placement 10/13/21: Exploratory laparotomy   Toribio Harbour L Rust-Chester 10/28/2022, 2:25 AM  Domingo Pulse [place], AGACNP-BC Acute Care Nurse Practitioner [facility]   [phone] / [phone] Please see Amion for pager details.

## 2022-11-10 NOTE — Progress Notes (Signed)
Pt extubated to room air per NP order. Pt NP, RN and family at bedside.

## 2022-11-10 NOTE — IPAL (Signed)
  Interdisciplinary Goals of Care Family Meeting   Date carried out: 11/06/2022  Location of the meeting: Bedside  Member's involved: Nurse Practitioner, Bedside Registered Nurse, and Family Member or next of kin  Durable Power of Attorney or acting medical decision maker: daughter, Colin Levine    Discussion: We discussed goals of care for Colin Levine. All family is present and the daughter is requesting to transition to comfort measures. All questions and concerns answered at this time.  Code status:   Code Status: DNR   Disposition: In-patient comfort care  Time spent for the meeting: 15 minutes    Renato Battles, NP  06-Nov-2022, 12:31 AM  Domingo Pulse [place], AGACNP-BC Acute Care Nurse Practitioner [place]   [phone] / [phone] Please see Amion for pager details.

## 2022-11-10 DEATH — deceased

## 2023-07-01 IMAGING — CR DG HIP (WITH OR WITHOUT PELVIS) 2-3V*L*
1 series · 3 of 3 positions shown · non-contrast
Comparison: None.

CLINICAL DATA: Fall, left hip pain

EXAM:
DG HIP (WITH OR WITHOUT PELVIS) 2-3V LEFT

[Series 1: dg hip unilat w or w/o pelvis 2-3 views  · non-contrast · 0.14mm/px · 3 of 3 slices shown]
[im 1/3]
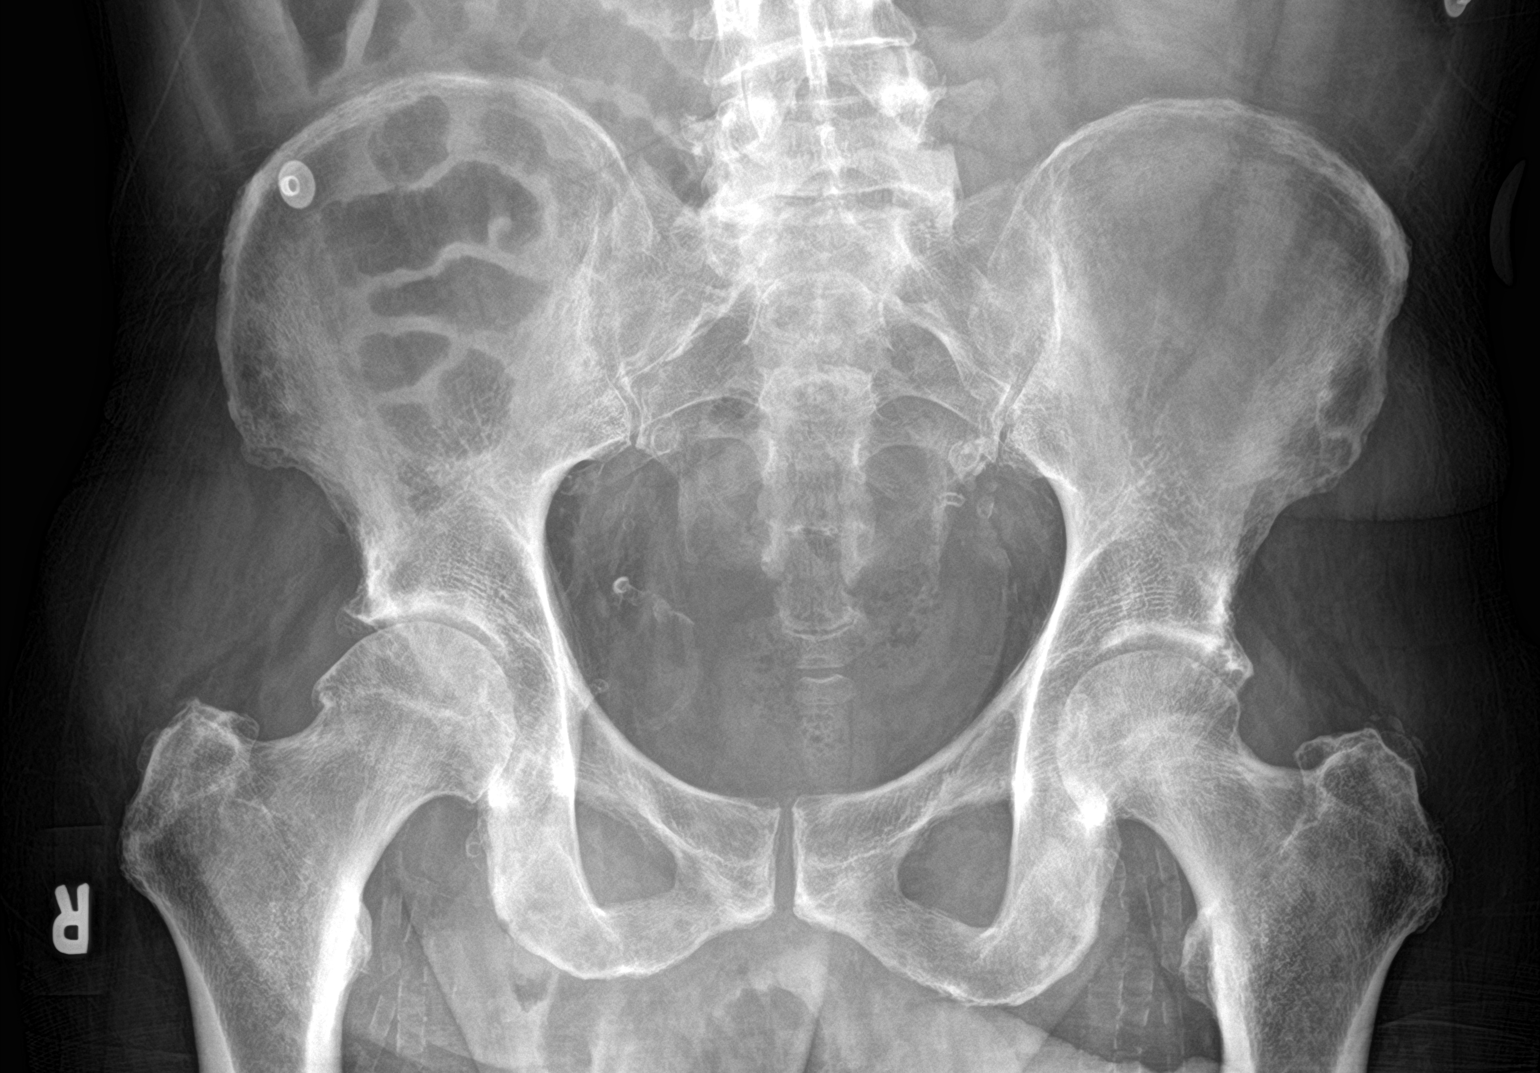
[im 2/3]
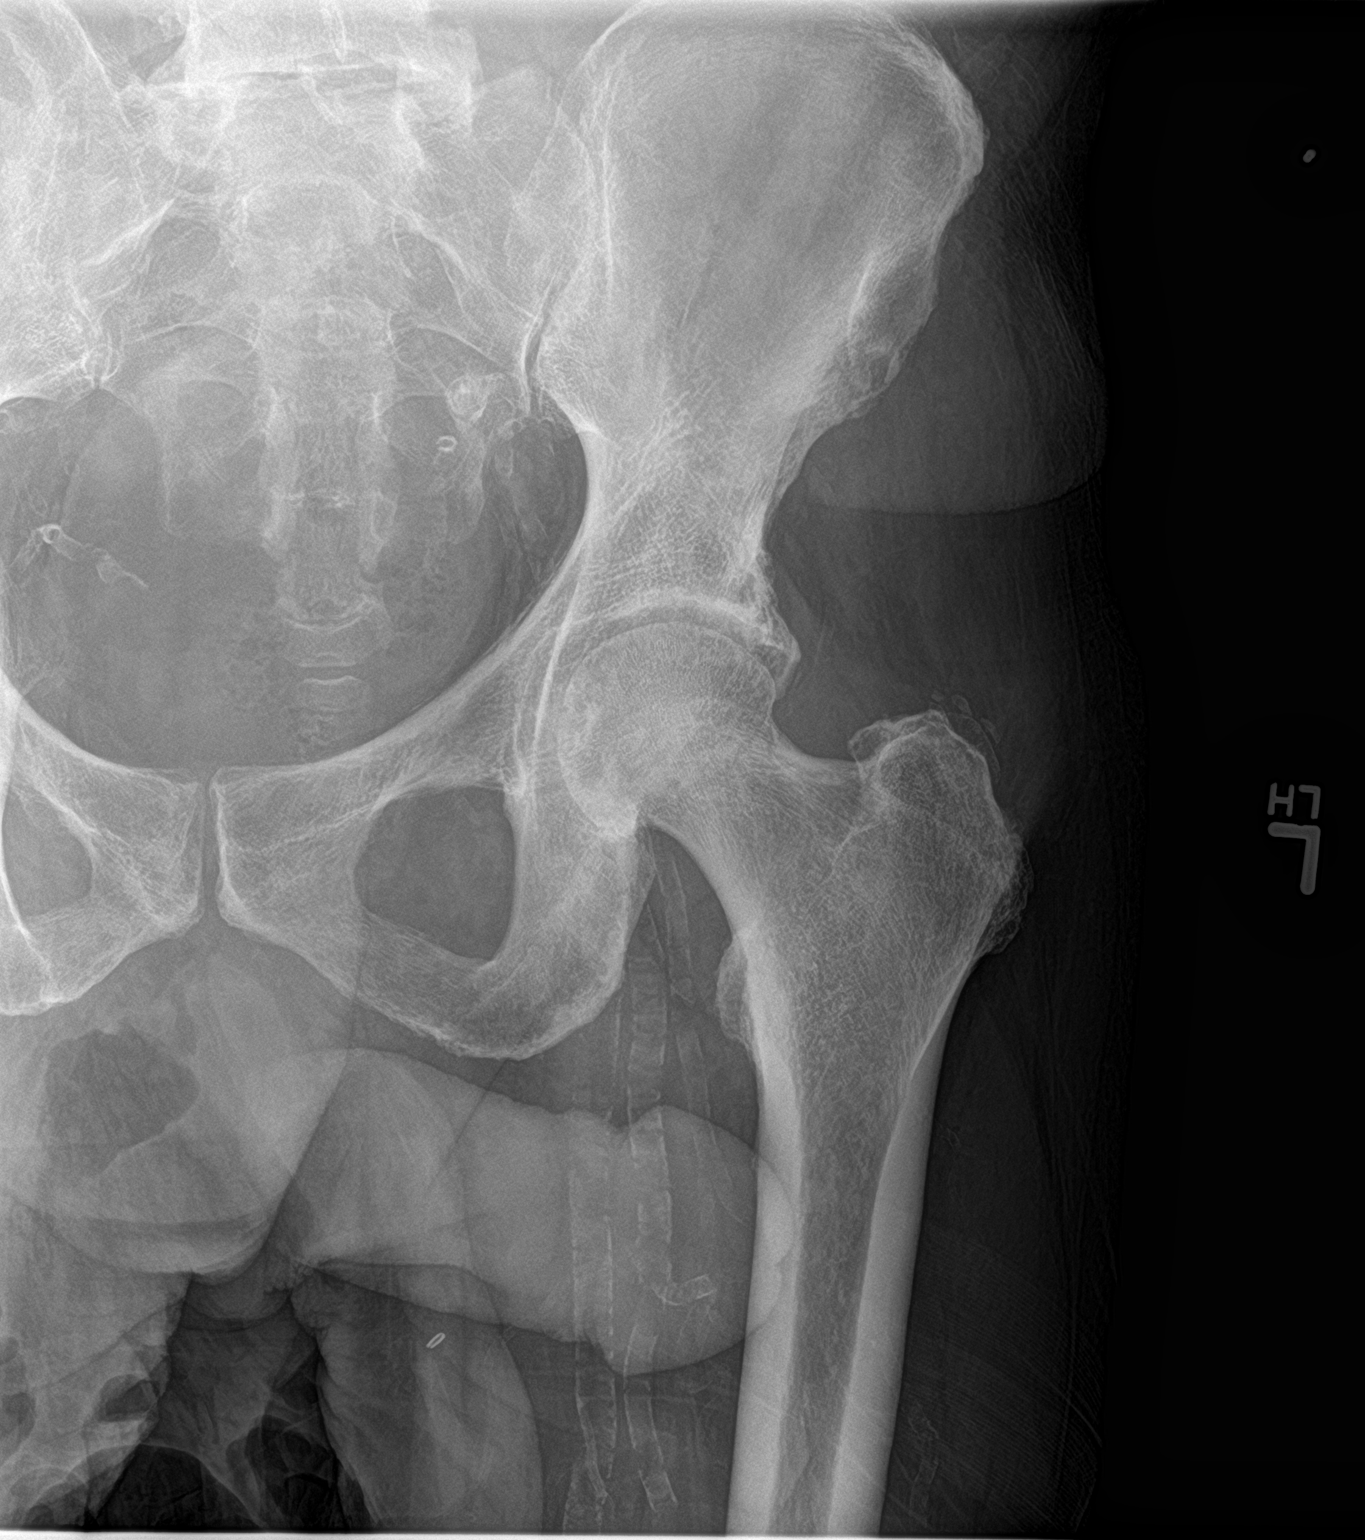
[im 3/3]
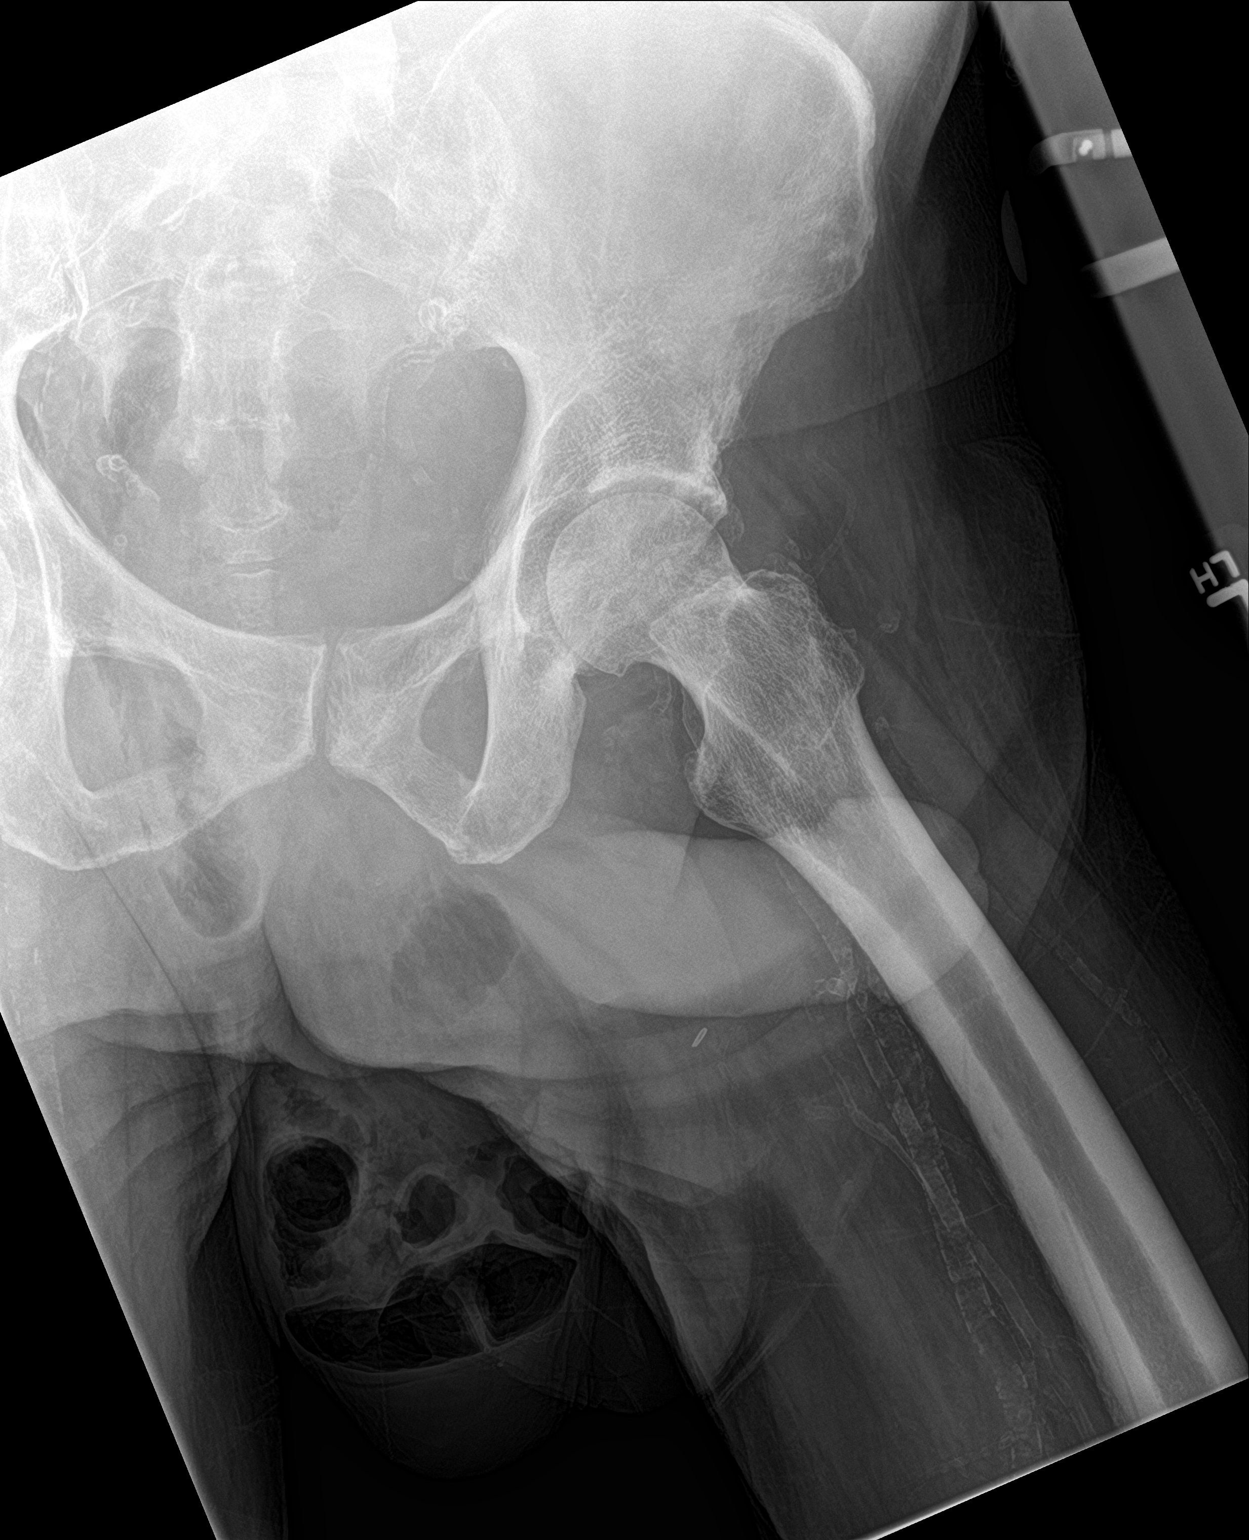

[3 of 3 positions shown; findings below may reference images not displayed]

FINDINGS: No acute fracture or dislocation identified. Mild narrowing of the
hip joint space with acetabular subchondral sclerosis and small
marginal osteophytes. No fracture visualized in the pelvis.

Arterial vascular calcifications noted. Large inguinal/scrotal
hernia which contains loops of bowel.
IMPRESSION: 1. No acute osseous abnormality identified.
2. Large inguinal/scrotal hernia which contains loops of bowel.
